# Patient Record
Sex: Female | Born: 1960 | Race: Black or African American | Hispanic: No | Marital: Married | Smoking: Never smoker
Health system: Southern US, Community
[De-identification: ages and names within clinical notes are randomized; demographics above are authoritative.]

## PROBLEM LIST (undated history)

## (undated) DIAGNOSIS — D649 Anemia, unspecified: Secondary | ICD-10-CM

## (undated) DIAGNOSIS — D869 Sarcoidosis, unspecified: Secondary | ICD-10-CM

## (undated) HISTORY — DX: Anemia, unspecified: D64.9

## (undated) HISTORY — DX: Sarcoidosis, unspecified: D86.9

---
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
## 1986-12-19 HISTORY — PX: HERNIA REPAIR: SHX51

## 1999-12-20 HISTORY — PX: CYST EXCISION: SHX5701

## 2005-12-19 HISTORY — PX: PARTIAL HYSTERECTOMY: SHX80

## 2015-05-22 ENCOUNTER — Encounter: Payer: Self-pay | Admitting: Internal Medicine

## 2015-05-22 ENCOUNTER — Ambulatory Visit (INDEPENDENT_AMBULATORY_CARE_PROVIDER_SITE_OTHER): Payer: BLUE CROSS/BLUE SHIELD | Admitting: Internal Medicine

## 2015-05-22 VITALS — BP 160/90 | HR 82 | Temp 98.3°F | Resp 18 | Ht 63.0 in | Wt 158.8 lb

## 2015-05-22 DIAGNOSIS — R030 Elevated blood-pressure reading, without diagnosis of hypertension: Secondary | ICD-10-CM

## 2015-05-22 DIAGNOSIS — L299 Pruritus, unspecified: Secondary | ICD-10-CM

## 2015-05-22 DIAGNOSIS — J309 Allergic rhinitis, unspecified: Secondary | ICD-10-CM

## 2015-05-22 DIAGNOSIS — D863 Sarcoidosis of skin: Secondary | ICD-10-CM | POA: Diagnosis not present

## 2015-05-22 MED ORDER — MONTELUKAST SODIUM 10 MG PO TABS: 10.0000 mg | ORAL_TABLET | Freq: Every day | ORAL | Status: DC

## 2015-05-22 NOTE — Progress Notes (Signed)
Patient ID: Christine Miles, female   DOB: 1961-01-09, 54 y.o.   MRN: [id number]    Location:    [facility]   Place of Service:  OFFICE    Advanced Directive information  LIVING WILL  Chief Complaint  Patient presents with  . Establish Care    Establish care    HPI  54 yo female seen today as a new pt. She has sarcoidosis and sees Dr Gypsy Lorehinnasami (Hematology in [facility]). She is stable. Currently not taking any meds.   She c/o cold intolerance of unknown duration.   She is c/a "allergies"  From food seasonings and environmental sources. She gets nasal congestion, sneezing, rhinorrhea, scratchy throat. No hives but feels itchy "a lot". Uses vasoline for skin care and occasionally mixes with  Jergen's. The itching occurs mostly at night. This is a family trait. She has not seen a rash. She has tried OTC antihistamine without relief.    Past Medical History  Diagnosis Date  . Anemia   . Sarcoidosis     Past Surgical History  Procedure Laterality Date  . Breast cyst excision Right 1980  . Hernia repair  1988    navel  . Cyst excision  2001    behind ear  . Cesarean section  1993  . Partial hysterectomy  2007    Patient Care Team: Kirt BoysMonica Najee Manninen, DO as PCP - General (Internal Medicine) Silvestre MomentBernard Chinnasami, MD (Hematology and Oncology)  History   Social History  . Marital Status: Married    Spouse Name: N/A  . Number of Children: N/A  . Years of Education: N/A   Occupational History  . Not on file.   Social History Main Topics  . Smoking status: Never Smoker   . Smokeless tobacco: Never Used  . Alcohol Use: No  . Drug Use: No  . Sexual Activity: Not on file   Other Topics Concern  . Not on file   Social History Narrative   Diet:      Do you drink/ eat things with caffeine? Yes, occassionally      Marital status:  Marrried                             What year were you married ?  1990      Do you live in a house, apartment,assistred living, condo, trailer,  etc.)? House      Is it one or more stories?       How many persons live in your home ?      Do you have any pets in your home ?(please list)  No      Current or past profession:      Do you exercise?  Yes                            Type & how often: 3-4 x weekly      Do you have a living will?  Yes       Do you have a DNR form?                       If not, do you want to discuss one? No      Do you have signed POA?HPOA forms?                 If so, please bring to  your        appointment           reports that she has never smoked. She has never used smokeless tobacco. She reports that she does not drink alcohol or use illicit drugs.  Family History  Problem Relation Age of Onset  . Cancer Mother   . Diabetes Father    Family Status  Relation Status Death Age  . Father Deceased 71  . Mother Deceased 45  . Sister Alive   . Brother Alive   . Brother Alive   . Daughter Alive   . Son Alive      There is no immunization history on file for this patient.  Not on File  Medications: Patient's Medications  New Prescriptions   No medications on file  Previous Medications   CHOLECALCIFEROL (PA VITAMIN D-3 GUMMY PO)    Take by mouth. Take 3000 /u by mouth daily   OMEGA-3 FATTY ACIDS (OMEGA-3 PO)    Take 100 mg by mouth daily  Modified Medications   No medications on file  Discontinued Medications   No medications on file    Review of Systems  Constitutional: Negative for fever, chills, diaphoresis, activity change, appetite change and fatigue.  HENT: Negative for ear pain and sore throat.   Eyes: Visual disturbance: corrective lenses.  Respiratory: Negative for cough, chest tightness and shortness of breath.   Cardiovascular: Negative for chest pain, palpitations and leg swelling.  Gastrointestinal: Negative for nausea, vomiting, abdominal pain, diarrhea, constipation and blood in stool.  Genitourinary: Negative for dysuria.  Musculoskeletal: Negative for  arthralgias.  Allergic/Immunologic: Positive for environmental allergies.  Neurological: Negative for dizziness, tremors, numbness and headaches.  Hematological: Bruises/bleeds easily (petechiae).  Psychiatric/Behavioral: Negative for sleep disturbance. The patient is not nervous/anxious.     Filed Vitals:   05/22/15 1302  BP: 160/90  Pulse: 82  Temp: 98.3 F (36.8 C)  TempSrc: Oral  Resp: 18  Height:  (1.6 m)  Weight: 158 lb 12.8 oz (72.031 kg)  SpO2: 98%   Body mass index is 28.14 kg/(m^2).  Physical Exam  Constitutional: She is oriented to person, place, and time. She appears well-developed and well-nourished.  HENT:  Mouth/Throat: Oropharynx is clear and moist. No oropharyngeal exudate.  Eyes: Pupils are equal, round, and reactive to light. No scleral icterus.  Neck: Neck supple. No tracheal deviation present. No thyromegaly present.  Cardiovascular: Normal rate, regular rhythm, normal heart sounds and intact distal pulses.  Exam reveals no gallop and no friction rub.   No murmur heard. No LE edema b/l. no calf TTP. No carotid bruit b/l  Pulmonary/Chest: Effort normal and breath sounds normal. No stridor. No respiratory distress. She has no wheezes. She has no rales.  Abdominal: Soft. Bowel sounds are normal. She exhibits no distension and no mass. There is no tenderness. There is no rebound and no guarding.  Lymphadenopathy:    She has no cervical adenopathy.  Neurological: She is alert and oriented to person, place, and time. She has normal reflexes.  Skin: Skin is warm and dry. No rash noted.  Psychiatric: She has a normal mood and affect. Her behavior is normal.     Labs reviewed: No results found for any previous visit.  No results found.   Assessment/Plan   ICD-9-CM ICD-10-CM   1. Elevated blood pressure (not hypertension) 796.2 R03.0   2. Itching - unknown etiology; worse at night 698.9 L29.9 Ambulatory referral to Allergy  3. Allergic  rhinitis,  unspecified allergic rhinitis type 477.9 J30.9 montelukast (SINGULAIR) 10 MG tablet     Ambulatory referral to Allergy  4.      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 Avoid using vasoline as it is occlusive. Recommend aquaphor ointment  --DASH diet discussed and education material supplied  --Check blood pressure at home twice daily and record. Bring readings to next appointment  --bring copy of Living Will to next appt  --Follow up in 2 mos to reck blood pressure. No med Rx today as she would like to try diet 1st  Christine Miles  [facility] [address] [place], [place] [address] [phone] Cell (Monday-Friday 8 AM - 5 PM) [phone] After 5 PM and follow prompts

## 2015-05-22 NOTE — Patient Instructions (Signed)
Check blood pressure at home twice daily and record. Bring readings to next appointment  Follow up in 2 mos to reck blood pressure.  DASH Eating Plan DASH stands for "Dietary Approaches to Stop Hypertension." The DASH eating plan is a healthy eating plan that has been shown to reduce high blood pressure (hypertension). Additional health benefits may include reducing the risk of type 2 diabetes mellitus, heart disease, and stroke. The DASH eating plan may also help with weight loss. WHAT DO I NEED TO KNOW ABOUT THE DASH EATING PLAN? For the DASH eating plan, you will follow these general guidelines:  Choose foods with a percent daily value for sodium of less than 5% (as listed on the food label).  Use salt-free seasonings or herbs instead of table salt or sea salt.  Check with your health care provider or pharmacist before using salt substitutes.  Eat lower-sodium products, often labeled as "lower sodium" or "no salt added."  Eat fresh foods.  Eat more vegetables, fruits, and low-fat dairy products.  Choose whole grains. Look for the word "whole" as the first word in the ingredient list.  Choose fish and skinless chicken or Mal[place]key more often than red meat. Limit fish, poultry, and meat to 6 oz (170 g) each day.  Limit sweets, desserts, sugars, and sugary drinks.  Choose heart-healthy fats.  Limit cheese to 1 oz (28 g) per day.  Eat more home-cooked food and less restaurant, buffet, and fast food.  Limit fried foods.  Cook foods using methods other than frying.  Limit canned vegetables. If you do use them, rinse them well to decrease the sodium.  When eating at a restaurant, ask that your food be prepared with less salt, or no salt if possible. WHAT FOODS CAN I EAT? Seek help from a dietitian for individual calorie needs. Grains Whole grain or whole wheat bread. Brown rice. Whole grain or whole wheat pasta. Quinoa, bulgur, and whole grain cereals. Low-sodium cereals. Corn or  whole wheat flour tortillas. Whole grain cornbread. Whole grain crackers. Low-sodium crackers. Vegetables Fresh or frozen vegetables (raw, steamed, roasted, or grilled). Low-sodium or reduced-sodium tomato and vegetable juices. Low-sodium or reduced-sodium tomato sauce and paste. Low-sodium or reduced-sodium canned vegetables.  Fruits All fresh, canned (in natural juice), or frozen fruits. Meat and Other Protein Products Ground beef (85% or leaner), grass-fed beef, or beef trimmed of fat. Skinless chicken or [place]urkey. Ground chicken or [place]urkey. Pork trimmed of fat. All fish and seafood. Eggs. Dried beans, peas, or lentils. Unsalted nuts and seeds. Unsalted canned beans. Dairy Low-fat dairy products, such as skim or 1% milk, 2% or reduced-fat cheeses, low-fat ricotta or cottage cheese, or plain low-fat yogurt. Low-sodium or reduced-sodium cheeses. Fats and Oils Tub margarines without trans fats. Light or reduced-fat mayonnaise and salad dressings (reduced sodium). Avocado. Safflower, olive, or canola oils. Natural peanut or almond butter. Other Unsalted popcorn and pretzels. The items listed above may not be a complete list of recommended foods or beverages. Contact your dietitian for more options. WHAT FOODS ARE NOT RECOMMENDED? Grains White bread. White pasta. White rice. Refined cornbread. Bagels and croissants. Crackers that contain trans fat. Vegetables Creamed or fried vegetables. Vegetables in a cheese sauce. Regular canned vegetables. Regular canned tomato sauce and paste. Regular tomato and vegetable juices. Fruits Dried fruits. Canned fruit in light or heavy syrup. Fruit juice. Meat and Other Protein Products Fatty cuts of meat. Ribs, chicken wings, bacon, sausage, bologna, salami, chitterlings, fatback, hot dogs, bratwurst, and packaged  luncheon meats. Salted nuts and seeds. Canned beans with salt. Dairy Whole or 2% milk, cream, half-and-half, and cream cheese. Whole-fat or sweetened  yogurt. Full-fat cheeses or blue cheese. Nondairy creamers and whipped toppings. Processed cheese, cheese spreads, or cheese curds. Condiments Onion and garlic salt, seasoned salt, table salt, and sea salt. Canned and packaged gravies. Worcestershire sauce. Tartar sauce. Barbecue sauce. Teriyaki sauce. Soy sauce, including reduced sodium. Steak sauce. Fish sauce. Oyster sauce. Cocktail sauce. Horseradish. Ketchup and mustard. Meat flavorings and tenderizers. Bouillon cubes. Hot sauce. Tabasco sauce. Marinades. Taco seasonings. Relishes. Fats and Oils Butter, stick margarine, lard, shortening, ghee, and bacon fat. Coconut, palm kernel, or palm oils. Regular salad dressings. Other Pickles and olives. Salted popcorn and pretzels. The items listed above may not be a complete list of foods and beverages to avoid. Contact your dietitian for more information. WHERE CAN I FIND MORE INFORMATION? National Heart, Lung, and Blood Institute: travelstabloid.com Document Released: 11/24/2011 Document Revised: 04/21/2014 Document Reviewed: 10/09/2013 [facility] Patient Information 2015 [place], [place]. This information is not intended to replace advice given to you by your health care provider. Make sure you discuss any questions you have with your health care provider.

## 2015-05-23 DIAGNOSIS — R030 Elevated blood-pressure reading, without diagnosis of hypertension: Secondary | ICD-10-CM | POA: Insufficient documentation

## 2015-05-23 DIAGNOSIS — D863 Sarcoidosis of skin: Secondary | ICD-10-CM | POA: Insufficient documentation

## 2015-05-23 DIAGNOSIS — J309 Allergic rhinitis, unspecified: Secondary | ICD-10-CM | POA: Insufficient documentation

## 2015-05-23 DIAGNOSIS — L299 Pruritus, unspecified: Secondary | ICD-10-CM | POA: Insufficient documentation

## 2015-07-22 ENCOUNTER — Encounter: Payer: Self-pay | Admitting: Internal Medicine

## 2015-07-22 ENCOUNTER — Ambulatory Visit: Payer: BLUE CROSS/BLUE SHIELD | Admitting: Internal Medicine

## 2015-08-07 ENCOUNTER — Ambulatory Visit (INDEPENDENT_AMBULATORY_CARE_PROVIDER_SITE_OTHER): Payer: BLUE CROSS/BLUE SHIELD | Admitting: Internal Medicine

## 2015-08-07 ENCOUNTER — Encounter: Payer: Self-pay | Admitting: Internal Medicine

## 2015-08-07 VITALS — BP 130/88 | HR 79 | Temp 98.2°F | Resp 18 | Ht 63.0 in | Wt 155.6 lb

## 2015-08-07 DIAGNOSIS — J309 Allergic rhinitis, unspecified: Secondary | ICD-10-CM

## 2015-08-07 DIAGNOSIS — R030 Elevated blood-pressure reading, without diagnosis of hypertension: Secondary | ICD-10-CM | POA: Diagnosis not present

## 2015-08-07 DIAGNOSIS — D863 Sarcoidosis of skin: Secondary | ICD-10-CM

## 2015-08-07 NOTE — Patient Instructions (Addendum)
Continue with lifestyle modifications - diet and exercise  Follow up in 3-4 mos for reck blood pressure

## 2015-08-07 NOTE — Progress Notes (Signed)
Patient ID: Christine Miles, female   DOB: November 08, 1961, 54 y.o.   MRN: [id number]    Location:    [facility]   Place of Service:  OFFICE   Chief Complaint  Patient presents with  . Medical Management of Chronic Issues    2 month follow-up for bp    HPI:  54 yo female seen today for f/u elevated blood pressure and seasonal allergy. She cancelled her allergy appt as she reports feeling better. Less itching. She now uses herbal remedy for fragrances and it helps. Stopped singulair as it was ineffective  She does not check her BP at home. She has changed her diet and is eating healthier and stopped using salt. She has lost about 3 lbs since last OV. She increased exercise  Past Medical History  Diagnosis Date  . Anemia   . Sarcoidosis     Past Surgical History  Procedure Laterality Date  . Breast cyst excision Right 1980  . Hernia repair  1988    navel  . Cyst excision  2001    behind ear  . Cesarean section  1993  . Partial hysterectomy  2007    Patient Care Team: Kirt Boys, DO as PCP - General (Internal Medicine) Silvestre Moment, MD (Hematology and Oncology)  Social History   Social History  . Marital Status: Married    Spouse Name: N/A  . Number of Children: N/A  . Years of Education: N/A   Occupational History  . Not on file.   Social History Main Topics  . Smoking status: Never Smoker   . Smokeless tobacco: Never Used  . Alcohol Use: No  . Drug Use: No  . Sexual Activity: Not on file   Other Topics Concern  . Not on file   Social History Narrative   Diet:      Do you drink/ eat things with caffeine? Yes, occassionally      Marital status:  Marrried                             What year were you married ?  1990      Do you live in a house, apartment,assistred living, condo, trailer, etc.)? House      Is it one or more stories?       How many persons live in your home ?      Do you have any pets in your home ?(please list)  No      Current or  past profession:      Do you exercise?  Yes                            Type & how often: 3-4 x weekly      Do you have a living will?  Yes       Do you have a DNR form?                       If not, do you want to discuss one? No      Do you have signed POA?HPOA forms?                 If so, please bring to your        appointment           reports that she has never smoked. She has never  used smokeless tobacco. She reports that she does not drink alcohol or use illicit drugs.  Not on File  Medications: Patient's Medications  New Prescriptions   No medications on file  Previous Medications   CHOLECALCIFEROL (PA VITAMIN D-3 GUMMY PO)    Take by mouth. Take 3000 /u by mouth daily   GLUCOSAMINE HCL-MSM (MSM GLUCOSAMINE PO)    Take 2 capsules by mouth daily   MELATONIN 300 MCG TABS    Take 3-4 tablets by mouth for sleep   MULTIPLE VITAMINS-MINERALS (MULTIVITAMIN GUMMIES WOMENS) CHEW    Take 2 gummies by mouth daily   NON FORMULARY    Native Remedies (AllergyEase  Scent & Phenol) spray 2-3 times in mouth daily   OMEGA-3 FATTY ACIDS (OMEGA-3 PO)    Take 100 mg by mouth daily  Modified Medications   No medications on file  Discontinued Medications   MONTELUKAST (SINGULAIR) 10 MG TABLET    Take 1 tablet (10 mg total) by mouth at bedtime.    Review of Systems  Constitutional: Negative for fever, chills, diaphoresis, activity change, appetite change and fatigue.  HENT: Negative for ear pain and sore throat.   Eyes: Negative for visual disturbance.  Respiratory: Negative for cough, chest tightness and shortness of breath.   Cardiovascular: Negative for chest pain, palpitations and leg swelling.  Gastrointestinal: Negative for nausea, vomiting, abdominal pain, diarrhea, constipation and blood in stool.  Genitourinary: Negative for dysuria.  Musculoskeletal: Negative for arthralgias.  Neurological: Negative for dizziness, tremors, numbness and headaches.  Psychiatric/Behavioral:  Negative for sleep disturbance. The patient is not nervous/anxious.     Filed Vitals:   08/07/15 1536  BP: 130/88 repeat by myself 156/94  Pulse: 79  Temp: 98.2 F (36.8 C)  TempSrc: Oral  Resp: 18  Height:  (1.6 m)  Weight: 155 lb 9.6 oz (70.58 kg)  SpO2: 97%   Body mass index is 27.57 kg/(m^2).  Physical Exam  Constitutional: She is oriented to person, place, and time. She appears well-developed and well-nourished.  HENT:  Mouth/Throat: Oropharynx is clear and moist. No oropharyngeal exudate.  Eyes: Pupils are equal, round, and reactive to light. No scleral icterus.  Neck: Neck supple. Carotid bruit is not present. No tracheal deviation present. No thyromegaly present.  Cardiovascular: Normal rate, regular rhythm, normal heart sounds and intact distal pulses.  Exam reveals no gallop and no friction rub.   No murmur heard. No LE edema b/l. no calf TTP.   Pulmonary/Chest: Effort normal and breath sounds normal. No stridor. No respiratory distress. She has no wheezes. She has no rales.  Abdominal: Soft. Bowel sounds are normal. She exhibits no distension and no mass. There is no hepatomegaly. There is no tenderness. There is no rebound and no guarding.  Lymphadenopathy:    She has no cervical adenopathy.  Neurological: She is alert and oriented to person, place, and time. She has normal reflexes.  Skin: Skin is warm and dry. No rash noted.  Psychiatric: She has a normal mood and affect. Her behavior is normal. Judgment and thought content normal.     Labs reviewed: No results found for any previous visit.  No results found.   Assessment/Plan   ICD-9-CM ICD-10-CM   1. Elevated blood pressure (not hypertension) 796.2 R03.0   2. Allergic rhinitis, unspecified allergic rhinitis type - stable 477.9 J30.9   3. Cutaneous sarcoidosis - stable 135 D86.3     Continue with lifestyle modifications - diet and exercise  Follow up  in 3-4 mos for BP. If still elevated, will  need to start antihypertensive  Kaleen Rochette S. Ancil Linsey  [facility] [address] [place], [place] [address] [phone] Cell (Monday-Friday 8 AM - 5 PM) [phone] After 5 PM and follow prompts

## 2017-07-20 LAB — HM MAMMOGRAPHY

## 2017-07-21 ENCOUNTER — Encounter: Payer: Self-pay | Admitting: *Deleted

## 2017-07-21 NOTE — Progress Notes (Signed)
[facility] [place]

## 2018-08-08 ENCOUNTER — Encounter: Payer: Self-pay | Admitting: Internal Medicine

## 2018-08-27 ENCOUNTER — Other Ambulatory Visit: Payer: Self-pay | Admitting: Internal Medicine

## 2018-08-27 DIAGNOSIS — Z1231 Encounter for screening mammogram for malignant neoplasm of breast: Secondary | ICD-10-CM

## 2018-08-29 ENCOUNTER — Other Ambulatory Visit: Payer: Self-pay | Admitting: Internal Medicine

## 2018-08-29 ENCOUNTER — Ambulatory Visit (INDEPENDENT_AMBULATORY_CARE_PROVIDER_SITE_OTHER): Payer: No Typology Code available for payment source

## 2018-08-29 DIAGNOSIS — Z1231 Encounter for screening mammogram for malignant neoplasm of breast: Secondary | ICD-10-CM

## 2018-09-12 ENCOUNTER — Ambulatory Visit: Payer: No Typology Code available for payment source | Admitting: Osteopathic Medicine

## 2018-09-26 ENCOUNTER — Ambulatory Visit (INDEPENDENT_AMBULATORY_CARE_PROVIDER_SITE_OTHER): Payer: No Typology Code available for payment source | Admitting: Osteopathic Medicine

## 2018-09-26 ENCOUNTER — Encounter (INDEPENDENT_AMBULATORY_CARE_PROVIDER_SITE_OTHER): Payer: Self-pay

## 2018-09-26 ENCOUNTER — Encounter: Payer: Self-pay | Admitting: Osteopathic Medicine

## 2018-09-26 VITALS — BP 150/88 | HR 71 | Temp 98.2°F | Ht 64.0 in | Wt 163.2 lb

## 2018-09-26 DIAGNOSIS — Z23 Encounter for immunization: Secondary | ICD-10-CM | POA: Diagnosis not present

## 2018-09-26 DIAGNOSIS — R0989 Other specified symptoms and signs involving the circulatory and respiratory systems: Secondary | ICD-10-CM

## 2018-09-26 NOTE — Patient Instructions (Signed)
Plan to return for nurse visit to verify home blood pressure cuff. In the meantime, be keeping a record of your blood pressures at home and bring this with you to the visit with the nurse.   If your cuff is measuring within 5-10 points of ours AND your home numbers are less than 140/90 (ideally less than 130/80) then nothing else to do.   If your home blood pressure cuff is inaccurate or is accurate but measuring above goal, we will need to talk about adjusting your medications.   

## 2018-09-26 NOTE — Progress Notes (Signed)
HPI: Christine Miles is a 57 y.o. female who  has a past medical history of Anemia and Sarcoidosis.  she presents to [facility] today, 09/26/18,  for chief complaint of: New to establish HTN  Recently following with OB/GYN, blood pressure elevated at 09/24/2018 visit, measuring 170/110, patient reports that it was rechecked and was a lot better.  BP Readings from Last 3 Encounters:  09/26/18 (!) 152/87 on recheck, intake was 182/110   08/07/15 130/88  05/22/15 (!) 160/90     Past medical, surgical, social and family history reviewed:  Patient Active Problem List   Diagnosis Date Noted  . 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 Rhinitis, allergic 05/23/2015  . Elevated blood pressure (not hypertension) 05/23/2015  . Itching 05/23/2015    Past Surgical History:  Procedure Laterality Date  . BREAST CYST EXCISION Right 1980   age 44  . CESAREAN SECTION  1993  . CYST EXCISION  2001   behind ear  . HERNIA REPAIR  1988   navel  . PARTIAL HYSTERECTOMY  2007    Social History   Tobacco Use  . Smoking status: Never Smoker  . Smokeless tobacco: Never Used  Substance Use Topics  . Alcohol use: No    Alcohol/week: 0.0 standard drinks    Family History  Problem Relation Age of Onset  . Diabetes Father   . Cancer Mother   . Breast cancer Mother   . Breast cancer Maternal Aunt      Current medication list and allergy/intolerance information reviewed:    Current Outpatient Medications  Medication Sig Dispense Refill  . Cholecalciferol (PA VITAMIN D-3 GUMMY PO) Take by mouth. Take 3000 /u by mouth daily    . Glucosamine HCl-MSM (MSM GLUCOSAMINE PO) Take 2 capsules by mouth daily    . Melatonin 300 MCG TABS Take 3-4 tablets by mouth for sleep    . Multiple Vitamins-Minerals (MULTIVITAMIN GUMMIES WOMENS) CHEW Take 2 gummies by mouth daily    . NON FORMULARY Native Remedies (AllergyEase  Scent & Phenol) spray 2-3 times in mouth daily    .  Omega-3 Fatty Acids (OMEGA-3 PO) Take 100 mg by mouth daily     No current facility-administered medications for this visit.     No Known Allergies    Review of Systems:  Constitutional:  No  fever, no chills, No recent illness, No unintentional weight changes. No significant fatigue.   HEENT: No  headache, no vision change, no hearing change, No sore throat, No  sinus pressure  Cardiac: No  chest pain, No  pressure, No palpitations, No  Orthopnea  Respiratory:  No  shortness of breath. No  Cough  Gastrointestinal: No  abdominal pain, No  nausea, No  vomiting,  No  blood in stool, No  diarrhea, No  constipation   Musculoskeletal: No new myalgia/arthralgia  Skin: No  Rash, No other wounds/concerning lesions  Genitourinary: No  incontinence, No  abnormal genital bleeding, No abnormal genital discharge  Hem/Onc: No  easy bruising/bleeding, No  abnormal lymph node  Endocrine: No cold intolerance,  No heat intolerance. No polyuria/polydipsia/polyphagia   Neurologic: No  weakness, No  dizziness, No  slurred speech/focal weakness/facial droop  Psychiatric: No  concerns with depression, No  concerns with anxiety, No sleep problems, No mood problems  Exam:  BP (!) 150/88   Pulse 71   Temp 98.2 F (36.8 C) (Oral)   Ht 5\' 4"  (1.626 m)   Wt 163 lb 3.2  oz (74 kg)   BMI 28.01 kg/m   Constitutional: VS see above. General Appearance: alert, well-developed, well-nourished, NAD  Eyes: Normal lids and conjunctive, non-icteric sclera  Ears, Nose, Mouth, Throat: MMM, Normal external inspection ears/nares/mouth/lips/gums.   Neck: No masses, trachea midline. No thyroid enlargement. No tenderness/mass appreciated. No lymphadenopathy  Respiratory: Normal respiratory effort. no wheeze, no rhonchi, no rales  Cardiovascular: S1/S2 normal, no murmur, no rub/gallop auscultated. RRR. No lower extremity edema. egaly, no splenomegaly. No hernia appreciated. Bowel sounds normal. Rectal exam  deferred.   Musculoskeletal: Gait normal. No clubbing/cyanosis of digits.   Neurological: Normal balance/coordination. No tremor. No cranial nerve deficit on limited exam. Motor and sensation intact and symmetric. Cerebellar reflexes intact.   Skin: warm, dry, intact. No rash/ulcer. No concerning nevi or subq nodules on limited exam.    Psychiatric: Normal judgment/insight. Normal mood and affect. Oriented x3.       ASSESSMENT/PLAN:   Labile blood pressure - Plan: CBC, COMPLETE METABOLIC PANEL WITH GFR, Lipid panel, TSH, Urinalysis, Routine w reflex microscopic  Need for Tdap vaccination - Plan: Tdap vaccine greater than or equal to 7yo IM    Patient Instructions   Plan to return for nurse visit to verify home blood pressure cuff. In the meantime, be keeping a record of you rblood pressures at home and bring this with you to the visit with the nurse.   If your cuff is measuring within 5-10 points of ours AND your home numbers are less than 140/90 (ideally less than 130/80) then nothing else to do.   If your home blood pressure cuff is inaccurate or is accurate but measuring above goal, we will need to talk about adjusting your medications.     Visit summary with medication list and pertinent instructions was printed for patient to review. All questions at time of visit were answered - patient instructed to contact office with any additional concerns. ER/RTC precautions were reviewed with the patient.   Follow-up plan: Return for nurse visit verify home BP monitor 1-2 weeks.    Please note: voice recognition software was used to produce this document, and typos may escape review. Please contact Dr. Lyn Hollingshead for any needed clarifications.

## 2018-09-27 LAB — COMPLETE METABOLIC PANEL WITH GFR
AG RATIO: 1.5 (calc) (ref 1.0–2.5)
ALT: 11 U/L (ref 6–29)
AST: 15 U/L (ref 10–35)
Albumin: 4.4 g/dL (ref 3.6–5.1)
Alkaline phosphatase (APISO): 66 U/L (ref 33–130)
BUN: 11 mg/dL (ref 7–25)
CALCIUM: 9.8 mg/dL (ref 8.6–10.4)
CO2: 28 mmol/L (ref 20–32)
CREATININE: 0.85 mg/dL (ref 0.50–1.05)
Chloride: 99 mmol/L (ref 98–110)
GFR, EST AFRICAN AMERICAN: 89 mL/min/{1.73_m2} (ref >=60)
GFR, EST NON AFRICAN AMERICAN: 77 mL/min/{1.73_m2} (ref >=60)
GLOBULIN: 3 g/dL (ref 1.9–3.7)
Glucose, Bld: 77 mg/dL (ref 65–99)
POTASSIUM: 4.5 mmol/L (ref 3.5–5.3)
SODIUM: 136 mmol/L (ref 135–146)
Total Bilirubin: 1.9 mg/dL — ABNORMAL HIGH (ref 0.2–1.2)
Total Protein: 7.4 g/dL (ref 6.1–8.1)

## 2018-09-27 LAB — LIPID PANEL
CHOL/HDL RATIO: 2.6 (calc) (ref <5.0)
Cholesterol: 148 mg/dL (ref <200)
HDL: 57 mg/dL (ref >50)
LDL Cholesterol (Calc): 78 mg/dL (calc)
NON-HDL CHOLESTEROL (CALC): 91 mg/dL (ref <130)
Triglycerides: 52 mg/dL (ref <150)

## 2018-09-27 LAB — CBC
HCT: 41.5 % (ref 35.0–45.0)
HEMOGLOBIN: 14 g/dL (ref 11.7–15.5)
MCH: 31 pg (ref 27.0–33.0)
MCHC: 33.7 g/dL (ref 32.0–36.0)
MCV: 91.8 fL (ref 80.0–100.0)
MPV: 10.5 fL (ref 7.5–12.5)
Platelets: 243 10*3/uL (ref 140–400)
RBC: 4.52 10*6/uL (ref 3.80–5.10)
RDW: 12 % (ref 11.0–15.0)
WBC: 3.1 10*3/uL — AB (ref 3.8–10.8)

## 2018-09-27 LAB — TSH: TSH: 1.08 m[IU]/L

## 2018-09-27 LAB — URINALYSIS, ROUTINE W REFLEX MICROSCOPIC
BILIRUBIN URINE: NEGATIVE
Glucose, UA: NEGATIVE
HGB URINE DIPSTICK: NEGATIVE
Leukocytes, UA: NEGATIVE
Nitrite: NEGATIVE
PROTEIN: NEGATIVE
Specific Gravity, Urine: 1.025 (ref 1.001–1.03)
pH: 5.5 (ref 5.0–8.0)

## 2018-09-28 ENCOUNTER — Encounter: Payer: Self-pay | Admitting: Osteopathic Medicine

## 2018-12-26 ENCOUNTER — Ambulatory Visit: Payer: No Typology Code available for payment source | Admitting: Osteopathic Medicine

## 2019-02-14 ENCOUNTER — Ambulatory Visit (INDEPENDENT_AMBULATORY_CARE_PROVIDER_SITE_OTHER): Payer: No Typology Code available for payment source | Admitting: Osteopathic Medicine

## 2019-02-14 ENCOUNTER — Encounter: Payer: Self-pay | Admitting: Osteopathic Medicine

## 2019-02-14 VITALS — BP 174/107 | HR 66 | Temp 98.1°F | Wt 163.2 lb

## 2019-02-14 DIAGNOSIS — R0989 Other specified symptoms and signs involving the circulatory and respiratory systems: Secondary | ICD-10-CM | POA: Diagnosis not present

## 2019-02-14 DIAGNOSIS — F320 Major depressive disorder, single episode, mild: Secondary | ICD-10-CM | POA: Diagnosis not present

## 2019-02-14 MED ORDER — SERTRALINE HCL 25 MG PO TABS: 25.0000 mg | ORAL_TABLET | Freq: Every day | ORAL | 0 refills | Status: DC

## 2019-02-14 MED ORDER — VALSARTAN-HYDROCHLOROTHIAZIDE 80-12.5 MG PO TABS: 1.0000 | ORAL_TABLET | Freq: Every day | ORAL | 0 refills | Status: DC

## 2019-02-14 NOTE — Progress Notes (Signed)
HPI: Christine Miles is a 58 y.o. female who  has a past medical history of Anemia and Sarcoidosis.  she presents to [facility] today, 02/14/19,  for chief complaint of:  BP Depression   Blood pressure: Patient was advised at last visit to return for nurse to check home blood pressure monitor and recheck blood pressure here, patient did not ever follow-up.  She states she is still checking blood pressure at the pharmacy periodically, anywhere from 138 systolic to 160s systolic.  No chest pain, pressure, shortness of breath.  Depression: Patient reports worsening moods over the past couple of months, depression with insomnia and occasional anxiety episodes.  She does not really feel anxiety is her main issue.  She and her husband have been going through issues with a custody battle with her grandniece.  Some other family issues.  Patient has never been on psychiatric medications before.  Would also like to discuss referral to therapist.    At today's visit 02/14/19 ... PMH, PSH, FH reviewed and updated as needed.  Current medication list and allergy/intolerance hx reviewed and updated as needed. (See remainder of HPI, ROS, Phys Exam below)          ASSESSMENT/PLAN: The primary encounter diagnosis was Labile blood pressure. A diagnosis of Depression, major, single episode, mild (HCC) was also pertinent to this visit.     Meds ordered this encounter  Medications  . valsartan-hydrochlorothiazide (DIOVAN-HCT) 80-12.5 MG tablet    Sig: Take 1 tablet by mouth daily.    Dispense:  90 tablet    Refill:  0  . sertraline (ZOLOFT) 25 MG tablet    Sig: Take 1 tablet (25 mg total) by mouth daily.    Dispense:  90 tablet    Refill:  0    Patient Instructions  Depression:   We are starting a medication today called Zoloft to help treat your depression. This is a daily medication to help control your symptoms.   I also highly encourage my patients  who are suffering from depression to seek care with a counselor or therapist. A therapist can coach you in techniques to recognize and deal with troubling thought patterns and behaviors. The ability to cope with external stressors is crucial to overall mental health. I have placed a referral to behavioral health for counseling/therapy. Please let us know if you don't hear back about that referral.   Expect a call or message form this office in the next 2 weeks to check in: If you're doing well on the medicine but not feeling any effect, we can increase the dose. If you're starting to feel some effect/improvement, we can hold off on a dose increase and reevaluate at your office visit.   Let's plan to follow up in the office in 4-6 weeks. At that time, we can talk about how well the medicine is working for you, and we can consider increasing the dose, adding another medicine, etc.   If we are having trouble finding a good medication regimen for you, we can consult with a psychiatrist to assist with medication management.   If you experience problematic side effects, please let me know ASAP - we can switch the medicine any time, and we don't need an appointment for this.   For immediate mental health services:  [facility], [address], [place], [place] [address], [phone]  [facility], [address], [place], [place] [address], [phone]  Any  emergency room  National Suicide Prevention [facility]eline, [phone]  Any questions or concerns, call me!     Blood pressure:  We are starting a medication today called valsartan-hydrochlorothiazide, this is 1 pill with 2 medications in it to help address blood pressure.  Our goal is to get the blood pressure numbers 140/90 or less, ideally 130/80 or less.  Let us have you back in the office in 1 week for a nurse to recheck blood pressure.  If at goal, can continue the medication.  If not, can  increase the dose of this medicine     Follow-up plan: Return for Nurse visit 1 week blood pressure recheck, visit with Dr. Lyn Hollingshead in 4-6 weeks recheck depression.                                                 ################################################# ################################################# ################################################# #################################################    Current Meds  Medication Sig  . Cholecalciferol (PA VITAMIN D-3 GUMMY PO) Take by mouth. Take 3000 /u by mouth daily  . Glucosamine HCl-MSM (MSM GLUCOSAMINE PO) Take 2 capsules by mouth daily  . Melatonin 300 MCG TABS Take 3-4 tablets by mouth for sleep  . Multiple Vitamins-Minerals (MULTIVITAMIN GUMMIES WOMENS) CHEW Take 2 gummies by mouth daily  . NON FORMULARY Native Remedies (AllergyEase  Scent & Phenol) spray 2-3 times in mouth daily  . Omega-3 Fatty Acids (OMEGA-3 PO) Take 100 mg by mouth daily    No Known Allergies     Review of Systems:  Constitutional: No recent illness  HEENT: No  headache, no vision change  Cardiac: No  chest pain, No  pressure, No palpitations  Respiratory:  No  shortness of breath. No  Cough  Gastrointestinal: No  abdominal pain, no change on bowel habits  Neurologic: No  weakness, No  Dizziness  Psychiatric: +concerns with depression, No  concerns with anxiety  Exam:  BP (!) 174/107 (BP Location: Left Arm, Patient Position: Sitting, Cuff Size: Normal)   Pulse 66   Temp 98.1 F (36.7 C) (Oral)   Wt 163 lb 3.2 oz (74 kg)   BMI 28.01 kg/m   Constitutional: VS see above. General Appearance: alert, well-developed, well-nourished, NAD  Eyes: Normal lids and conjunctive, non-icteric sclera  Ears, Nose, Mouth, Throat: MMM, Normal external inspection ears/nares/mouth/lips/gums.  Neck: No masses, trachea midline.   Respiratory: Normal respiratory effort. no wheeze, no rhonchi, no  rales  Cardiovascular: S1/S2 normal, no murmur, no rub/gallop auscultated. RRR.   Musculoskeletal: Gait normal. Symmetric and independent movement of all extremities  Neurological: Normal balance/coordination. No tremor.  Skin: warm, dry, intact.   Psychiatric: Normal judgment/insight. Normal mood and affect. Oriented x3.       Visit summary with medication list and pertinent instructions was printed for patient to review, patient was advised to alert [place] if any updates are needed. All questions at time of visit were answered - patient instructed to contact office with any additional concerns. ER/RTC precautions were reviewed with the patient and understanding verbalized.     Please note: voice recognition software was used to produce this document, and typos may escape review. Please contact Dr. Lyn Hollingshead for any needed clarifications.    Follow up plan: Return for Nurse visit 1 week blood pressure recheck, visit with Dr. Lyn Hollingshead in 4-6 weeks recheck depression.

## 2019-02-14 NOTE — Patient Instructions (Addendum)
Depression:   We are starting a medication today called Zoloft to help treat your depression. This is a daily medication to help control your symptoms.   I also highly encourage my patients who are suffering from depression to seek care with a counselor or therapist. A therapist can coach you in techniques to recognize and deal with troubling thought patterns and behaviors. The ability to cope with external stressors is crucial to overall mental health. I have placed a referral to behavioral health for counseling/therapy. Please let us know if you don't hear back about that referral.   Expect a call or message form this office in the next 2 weeks to check in: If you're doing well on the medicine but not feeling any effect, we can increase the dose. If you're starting to feel some effect/improvement, we can hold off on a dose increase and reevaluate at your office visit.   Let's plan to follow up in the office in 4-6 weeks. At that time, we can talk about how well the medicine is working for you, and we can consider increasing the dose, adding another medicine, etc.   If we are having trouble finding a good medication regimen for you, we can consult with a psychiatrist to assist with medication management.   If you experience problematic side effects, please let me know ASAP - we can switch the medicine any time, and we don't need an appointment for this.   For immediate mental health services:  [facility], [address] [place], [place] [address], [phone]  [facility], [address] [place], [place] [address], [phone]  Any emergency room  National Suicide Prevention [facility]eline, [phone]  Any questions or concerns, call me!     Blood pressure:  We are starting a medication today called valsartan-hydrochlorothiazide, this is 1 pill with 2 medications in it to help address blood pressure.  Our goal is to get the blood  pressure numbers 140/90 or less, ideally 130/80 or less.  Let us have you back in the office in 1 week for a nurse to recheck blood pressure.  If at goal, can continue the medication.  If not, can increase the dose of this medicine

## 2019-02-20 ENCOUNTER — Ambulatory Visit: Payer: No Typology Code available for payment source | Admitting: Osteopathic Medicine

## 2019-02-26 ENCOUNTER — Telehealth: Payer: Self-pay

## 2019-02-26 NOTE — Telephone Encounter (Signed)
Pt called - wanted provider to be aware that she is doing well on Valsartan-hctz rx. She has been checking her blood pressure and today's reading was 122/78. No other inquiries during the call.

## 2019-02-26 NOTE — Telephone Encounter (Signed)
Excellent, thanks. 

## 2019-03-11 ENCOUNTER — Telehealth: Payer: Self-pay

## 2019-03-11 NOTE — Telephone Encounter (Signed)
Christine Miles called and asked if having a history of low white count puts her in a high risk for a severe symptoms from the coronavirus. Please advise.

## 2019-03-11 NOTE — Telephone Encounter (Signed)
We do not really know the answer to that question.  It is possible that it does.  However that really should not change the recommendations of trying to reduce your risk of contracting coronavirus.

## 2019-03-12 NOTE — Telephone Encounter (Signed)
Left message advising of recommendations.  

## 2019-07-08 ENCOUNTER — Other Ambulatory Visit: Payer: Self-pay | Admitting: Osteopathic Medicine

## 2019-07-11 ENCOUNTER — Other Ambulatory Visit: Payer: Self-pay | Admitting: Osteopathic Medicine

## 2019-07-11 ENCOUNTER — Telehealth: Payer: Self-pay

## 2019-07-11 NOTE — Telephone Encounter (Signed)
Pt called regarding denial for rx refill. As per provider - pt is overdue for a f/u appt. Pls contact pt to schedule a virtual appt. Thanks.   Routing to PCP as FYI.

## 2019-07-11 NOTE — Telephone Encounter (Signed)
Requested medications are due for refill today?  No  Requested medications are on the active medication list?  No  Last refill-Discontinued on medication list on 07/09/2019  Future visit scheduled - No  Notes to clinic  Requested Prescriptions  Pending Prescriptions Disp Refills   hydrochlorothiazide (HYDRODIURIL) 12.5 MG tablet [Pharmacy Med Name: hydroCHLOROthiazide 12.5 MG Oral Tablet] 90 tablet 0    Sig: Take 1 tablet by mouth once daily     Cardiovascular: Diuretics - Thiazide Failed - 07/11/2019  1:00 PM      Failed - Last BP in normal range    BP Readings from Last 1 Encounters:  02/14/19 (!) 174/107         Failed - Valid encounter within last 6 months    Recent Outpatient Visits          4 months ago Labile blood pressure   [facility], [place], DO   9 months ago Labile blood pressure   [facility], [place], DO             Passed - Ca in normal range and within 360 days    Calcium  Date Value Ref Range Status  09/26/2018 9.8 8.6 - 10.4 mg/dL Final         Passed - Cr in normal range and within 360 days    Creat  Date Value Ref Range Status  09/26/2018 0.85 0.50 - 1.05 mg/dL Final    Comment:    For patients >63 years of age, the reference limit for Creatinine is approximately 13% higher for people identified as African-American. .          Passed - K in normal range and within 360 days    Potassium  Date Value Ref Range Status  09/26/2018 4.5 3.5 - 5.3 mmol/L Final         Passed - Na in normal range and within 360 days    Sodium  Date Value Ref Range Status  09/26/2018 136 135 - 146 mmol/L Final

## 2019-07-11 NOTE — Telephone Encounter (Signed)
I sent 30 days of medications but patient is due for a follow-up appointment.  When she last saw me 02/14/2019, we discussed following up for a nurse visit in 1 week to recheck blood pressure and a visit with me in 4 to 6 weeks to recheck depression, she has not been in our office since.  If she does not feel comfortable coming in, we at least need to do a virtual visit

## 2019-07-12 ENCOUNTER — Other Ambulatory Visit: Payer: Self-pay | Admitting: Osteopathic Medicine

## 2019-07-15 NOTE — Telephone Encounter (Signed)
Appointment has been made. No further questions at this time.  

## 2019-07-24 ENCOUNTER — Encounter: Payer: Self-pay | Admitting: Osteopathic Medicine

## 2019-07-24 ENCOUNTER — Ambulatory Visit (INDEPENDENT_AMBULATORY_CARE_PROVIDER_SITE_OTHER): Payer: No Typology Code available for payment source | Admitting: Osteopathic Medicine

## 2019-07-24 VITALS — Wt 167.0 lb

## 2019-07-24 DIAGNOSIS — R0989 Other specified symptoms and signs involving the circulatory and respiratory systems: Secondary | ICD-10-CM | POA: Diagnosis not present

## 2019-07-24 DIAGNOSIS — I10 Essential (primary) hypertension: Secondary | ICD-10-CM

## 2019-07-24 DIAGNOSIS — F320 Major depressive disorder, single episode, mild: Secondary | ICD-10-CM | POA: Diagnosis not present

## 2019-07-24 MED ORDER — SERTRALINE HCL 25 MG PO TABS: 25.0000 mg | ORAL_TABLET | Freq: Every day | ORAL | 3 refills | Status: DC

## 2019-07-24 MED ORDER — HYDROCHLOROTHIAZIDE 25 MG PO TABS: 12.5000 mg | ORAL_TABLET | Freq: Every day | ORAL | 0 refills | Status: DC

## 2019-07-24 NOTE — Progress Notes (Signed)
Virtual Visit via Video (App used: Doximity) Note  I connected with      Christine Miles on 07/24/19 at 1:00 PM by a telemedicine application and verified that I am speaking with the correct person using two identifiers.  Patient is at home I am in office    I discussed the limitations of evaluation and management by telemedicine and the availability of in person appointments. The patient expressed understanding and agreed to proceed.  History of Present Illness: Christine Miles is a 58 y.o. female who would like to discuss medication follow-up, depression and HTN   Patient has not been seen in the office since 02/07/2019.  At that point, we started new medications for depression as well as high blood pressure, patient did not follow-up as directed for nurse visit blood pressure check 1 week after that visit, or follow-up with me 4 to 6 weeks after that visit to recheck depression.  HTN: Patient had been checking her blood pressure at pharmacy.  Valsartan HCTZ 80-12.5 was sent for 90-day supply on 02/14/2019. Looks like we sent separate Rx for HCT and valsartan 06/2019, pt states she never got the valsartan d/t backorder but we never got any notification of a problem from the pharmacy. She is taking HCTZ 12.5 mg daily   BP Readings from Last 3 Encounters:  02/14/19 (!) 174/107  09/26/18 (!) 150/88  08/07/15 130/88   Wt Readings from Last 3 Encounters:  07/24/19 167 lb (75.8 kg)  02/14/19 163 lb 3.2 oz (74 kg)  09/26/18 163 lb 3.2 oz (74 kg)    Depression: Zoloft 25 mg was last sent 07/09/2019 for 30 days. Skips days here and there when she forgets but overall no issues.   Depression screen [facility] 2/9 07/24/2019 09/26/2018 05/22/2015  Decreased Interest 0 2 0  Down, Depressed, Hopeless 0 2 0  PHQ - 2 Score 0 4 0  Altered sleeping - 3 -  Tired, decreased energy - 2 -  Change in appetite - 2 -  Feeling bad or failure about yourself  - 1 -  Trouble concentrating - 1 -  Moving  slowly or fidgety/restless - 2 -  Suicidal thoughts - 0 -  PHQ-9 Score - 15 -  Difficult doing work/chores - Somewhat difficult -   GAD 7 : Generalized Anxiety Score 07/24/2019 09/26/2018  Nervous, Anxious, on Edge 0 0  Control/stop worrying 0 0  Worry too much - different things 0 1  Trouble relaxing 0 1  Restless 1 1  Easily annoyed or irritable 1 1  Afraid - awful might happen 0 0  Total GAD 7 Score 2 4  Anxiety Difficulty Not difficult at all Somewhat difficult           Observations/Objective: Wt 167 lb (75.8 kg)   BMI 28.67 kg/m  BP Readings from Last 3 Encounters:  02/14/19 (!) 174/107  09/26/18 (!) 150/88  08/07/15 130/88   Exam: Normal Speech.  NAD  Lab and Radiology Results No results found for this or any previous visit (from the past 72 hour(s)). No results found.     Assessment and Plan: 58 y.o. female with The primary encounter diagnosis was Labile blood pressure. A diagnosis of Depression, major, single episode, mild ([place]) was also pertinent to this visit.   PDMP not reviewed this encounter. No orders of the defined types were placed in this encounter.  Meds ordered this encounter  Medications  . DISCONTD: sertraline (ZOLOFT) 25 MG tablet  Sig: Take 1 tablet (25 mg total) by mouth daily.    Dispense:  90 tablet    Refill:  3    No refills. Pt is overdue for a f/u appt w/PCP.  . hydrochlorothiazide (HYDRODIURIL) 25 MG tablet    Sig: Take 0.5 tablets (12.5 mg total) by mouth daily.    Dispense:  90 tablet    Refill:  0  . sertraline (ZOLOFT) 25 MG tablet    Sig: Take 1 tablet (25 mg total) by mouth daily.    Dispense:  90 tablet    Refill:  3      Follow Up Instructions: Return in about 4 weeks (around 08/21/2019) for LAB VISIT - ORDERS ARE IN .    I discussed the assessment and treatment plan with the patient. The patient was provided an opportunity to ask questions and all were answered. The patient agreed with the plan and  demonstrated an understanding of the instructions.   The patient was advised to call back or seek an in-person evaluation if any new concerns, if symptoms worsen or if the condition fails to improve as anticipated.  25 minutes of non-face-to-face time was provided during this encounter.                      Historical information moved to improve visibility of documentation.  Past Medical History:  Diagnosis Date  . Anemia   . Sarcoidosis    Past Surgical History:  Procedure Laterality Date  . BREAST CYST EXCISION Right 1980   age 58  . CESAREAN SECTION  1993  . CYST EXCISION  2001   behind ear  . HERNIA REPAIR  1988   navel  . PARTIAL HYSTERECTOMY  2007   Social History   Tobacco Use  . Smoking status: Never Smoker  . Smokeless tobacco: Never Used  Substance Use Topics  . Alcohol use: No    Alcohol/week: 0.0 standard drinks   family history includes Breast cancer in her maternal aunt and mother; Cancer in her mother; Diabetes in her father.  Medications: Current Outpatient Medications  Medication Sig Dispense Refill  . Cholecalciferol (PA VITAMIN D-3 GUMMY PO) Take by mouth. Take 3000 /u by mouth daily    . Melatonin 300 MCG TABS Take 3-4 tablets by mouth for sleep    . Multiple Vitamins-Minerals (MULTIVITAMIN GUMMIES WOMENS) CHEW Take 2 gummies by mouth daily    . Omega-3 Fatty Acids (OMEGA-3 PO) Take 100 mg by mouth daily    . sertraline (ZOLOFT) 25 MG tablet Take 1 tablet (25 mg total) by mouth daily. 90 tablet 3  . Glucosamine HCl-MSM (MSM GLUCOSAMINE PO) Take 2 capsules by mouth daily    . hydrochlorothiazide (HYDRODIURIL) 25 MG tablet Take 0.5 tablets (12.5 mg total) by mouth daily. 90 tablet 0  . NON FORMULARY Native Remedies (AllergyEase  Scent & Phenol) spray 2-3 times in mouth daily     No current facility-administered medications for this visit.    No Known Allergies  PDMP not reviewed this encounter. No orders of the defined types  were placed in this encounter.  Meds ordered this encounter  Medications  . DISCONTD: sertraline (ZOLOFT) 25 MG tablet    Sig: Take 1 tablet (25 mg total) by mouth daily.    Dispense:  90 tablet    Refill:  3    No refills. Pt is overdue for a f/u appt w/PCP.  . hydrochlorothiazide (HYDRODIURIL) 25 MG tablet  Sig: Take 0.5 tablets (12.5 mg total) by mouth daily.    Dispense:  90 tablet    Refill:  0  . sertraline (ZOLOFT) 25 MG tablet    Sig: Take 1 tablet (25 mg total) by mouth daily.    Dispense:  90 tablet    Refill:  3

## 2019-08-07 ENCOUNTER — Telehealth: Payer: Self-pay | Admitting: Osteopathic Medicine

## 2019-08-07 MED ORDER — VALSARTAN 80 MG PO TABS: 80.0000 mg | ORAL_TABLET | Freq: Every day | ORAL | 0 refills | Status: DC

## 2019-08-07 NOTE — Telephone Encounter (Signed)
-----   Message from Willy Eddy, RN sent at 08/06/2019  9:57 AM EDT ----- Regarding: Epic error Good morning Dr. Sheppard Coil,   Do to an issue with Epic the refill order did not go through or was auto cancelled.  We are working with Epic and hope to have this issue resolved by 08/08/19.  We apologize for any inconvenience this may cause.  Please place a new order for the medication.  VALSARTAN 80 MG PO TABS   Thank you,  Zebedee Iba, RN, BSN Ambulatory Analyst

## 2019-09-18 ENCOUNTER — Other Ambulatory Visit: Payer: Self-pay | Admitting: Osteopathic Medicine

## 2019-09-18 DIAGNOSIS — Z1231 Encounter for screening mammogram for malignant neoplasm of breast: Secondary | ICD-10-CM

## 2019-10-10 ENCOUNTER — Ambulatory Visit (INDEPENDENT_AMBULATORY_CARE_PROVIDER_SITE_OTHER): Payer: No Typology Code available for payment source

## 2019-10-10 ENCOUNTER — Other Ambulatory Visit: Payer: Self-pay

## 2019-10-10 DIAGNOSIS — Z1231 Encounter for screening mammogram for malignant neoplasm of breast: Secondary | ICD-10-CM

## 2019-11-05 ENCOUNTER — Encounter: Payer: Self-pay | Admitting: Osteopathic Medicine

## 2019-11-05 ENCOUNTER — Ambulatory Visit (INDEPENDENT_AMBULATORY_CARE_PROVIDER_SITE_OTHER): Payer: No Typology Code available for payment source | Admitting: Osteopathic Medicine

## 2019-11-05 DIAGNOSIS — M545 Low back pain, unspecified: Secondary | ICD-10-CM

## 2019-11-05 DIAGNOSIS — G8929 Other chronic pain: Secondary | ICD-10-CM

## 2019-11-05 DIAGNOSIS — N951 Menopausal and female climacteric states: Secondary | ICD-10-CM | POA: Insufficient documentation

## 2019-11-05 MED ORDER — CYCLOBENZAPRINE HCL 10 MG PO TABS: 5.0000 mg | ORAL_TABLET | Freq: Three times a day (TID) | ORAL | 0 refills | Status: DC | PRN

## 2019-11-05 MED ORDER — NAPROXEN 500 MG PO TABS: 500.0000 mg | ORAL_TABLET | Freq: Two times a day (BID) | ORAL | 1 refills | Status: DC

## 2019-11-05 MED ORDER — PREDNISONE 20 MG PO TABS: 20.0000 mg | ORAL_TABLET | Freq: Two times a day (BID) | ORAL | 0 refills | Status: DC

## 2019-11-05 MED ORDER — ESTRADIOL 1 MG PO TABS: 1.0000 mg | ORAL_TABLET | Freq: Every day | ORAL | 1 refills | Status: DC

## 2019-11-05 NOTE — Progress Notes (Signed)
Virtual Visit via Video (App used: Doximity) Note  I connected with      Christine Miles on 11/05/19 at 12:36 PM by a telemedicine application and verified that I am speaking with the correct person using two identifiers.  Patient is AT HOME I am in office   I discussed the limitations of evaluation and management by telemedicine and the availability of in person appointments. The patient expressed understanding and agreed to proceed.  History of Present Illness: Christine Miles is a 58 y.o. female who would like to discuss BACK PAIN, MENOPAUSE   Back pain . Location/Quality: lower back, feels like pressure, worse on the left  . Severity, Duration: few months getting worse over that time  . Modifying factors: worse w/ standing, has done some stretching which helps   Menopause  . Location:  . Quality: can't sleep due to hot flashes  . Severity: . Duration: . Timing: . Context: . Modifying factors: . Assoc signs/symptoms:  HTN: Above goal last visit 01/2019, last was 124/79 last week.  Pt had been taking BP at pharmacy, no longer an option d/t COVID pandemic. Should be on Valsartan + HCT.     Past Surgical History:  Procedure Laterality Date  . BREAST CYST EXCISION Right 1980   age 80  . CESAREAN SECTION  1993  . CYST EXCISION  2001   behind ear  . HERNIA REPAIR  1988   navel  . PARTIAL HYSTERECTOMY  2007    .  Observations/Objective: There were no vitals taken for this visit. BP Readings from Last 3 Encounters:  02/14/19 (!) 174/107  09/26/18 (!) 150/88  08/07/15 130/88   Exam: Normal Speech.  NAD  Lab and Radiology Results No results found for this or any previous visit (from the past 72 hour(s)). No results found.     Assessment and Plan: 58 y.o. female with The primary encounter diagnosis was Chronic left-sided low back pain, unspecified whether sciatica present. A diagnosis of Hot flashes, menopausal was also pertinent to this  visit.   PDMP not reviewed this encounter. Orders Placed This Encounter  Procedures  . DG Lumbar Spine Complete    Order Specific Question:   Reason for Exam (SYMPTOM  OR DIAGNOSIS REQUIRED)    Answer:   pain    Order Specific Question:   Is patient pregnant?    Answer:   No    Order Specific Question:   Preferred imaging location?    Answer:   [facility]    Order Specific Question:   Radiology Contrast Protocol - do NOT remove file path    Answer:   [email or web address]   Meds ordered this encounter  Medications  . predniSONE (DELTASONE) 20 MG tablet    Sig: Take 1 tablet (20 mg total) by mouth 2 (two) times daily with a meal.    Dispense:  10 tablet    Refill:  0  . naproxen (NAPROSYN) 500 MG tablet    Sig: Take 1 tablet (500 mg total) by mouth 2 (two) times daily with a meal. Take every day for one week, then use as needed after that    Dispense:  60 tablet    Refill:  1  . cyclobenzaprine (FLEXERIL) 10 MG tablet    Sig: Take 0.5-1 tablets (5-10 mg total) by mouth 3 (three) times daily as needed for muscle spasms. Caution: can cause drowsiness    Dispense:  30 tablet    Refill:  0  .  estradiol (ESTRACE) 1 MG tablet    Sig: Take 1 tablet (1 mg total) by mouth daily.    Dispense:  90 tablet    Refill:  1   Patient Instructions  Plan:   Back pain:  I sent some medications to help treat this. Naprosyn/naproxen anti-inflammatory - 500 mg twice daily for a week, then can use as needed after that for aches and pains Prednisone steroid - 20 mg twice daily with a meal, can take with breakfast and lunch Flexeril/cyclobenzaprine muscle relaxer -10 mg up to 3 times a day as needed.  This may cause some drowsiness, can take half a tablet if desired.  If these do not seem to be helping, please let me know.  I have placed an order to get an x-ray done, you can come into the imaging department on the first floor of our building  anytime.  We can also place a referral for physical therapy, depending on how the Covid numbers are looking over the next couple weeks/months.   Menopause:  I have sent in a prescription for estrogen, 1 mg tablet to take daily.  Let me know whether this is helping or not!   Labs:  I can see the blood work that the hematologist is taking care of.  This includes blood counts but does not include cholesterol screening, sugar screening, liver/kidney function.  Sometime in the next 3 months, we can get blood work done here but let us not worry about it for now.  I would not want to wait too long though, we should be checking kidney function especially, given the medications that you are taking for blood pressure.     Instructions sent via MyChart. If MyChart not available, pt was given option for info via personal e-mail w/ no guarantee of protected health info over unsecured e-mail communication, and MyChart sign-up instructions were sent to patient.   Follow Up Instructions: Return for RECHECK PENDING TREATMENT FOR BACK PAIN & MENOPAUSE / IF WORSE OR CHANGE. ANNUAL IN NEXT 3-6 MOS .    I discussed the assessment and treatment plan with the patient. The patient was provided an opportunity to ask questions and all were answered. The patient agreed with the plan and demonstrated an understanding of the instructions.   The patient was advised to call back or seek an in-person evaluation if any new concerns, if symptoms worsen or if the condition fails to improve as anticipated.  25 minutes of non-face-to-face time was provided during this encounter.      . . . . . . . . . . . . . Marland Kitchen                   Historical information moved to improve visibility of documentation.  Past Medical History:  Diagnosis Date  . Anemia   . Sarcoidosis    Past Surgical History:  Procedure Laterality Date  . BREAST CYST EXCISION Right 1980   age 25  . CESAREAN SECTION  1993   . CYST EXCISION  2001   behind ear  . HERNIA REPAIR  1988   navel  . PARTIAL HYSTERECTOMY  2007   Social History   Tobacco Use  . Smoking status: Never Smoker  . Smokeless tobacco: Never Used  Substance Use Topics  . Alcohol use: No    Alcohol/week: 0.0 standard drinks   family history includes Breast cancer in her maternal aunt and mother; Cancer in her mother; Diabetes in her  father.  Medications: Current Outpatient Medications  Medication Sig Dispense Refill  . Cholecalciferol (PA VITAMIN D-3 GUMMY PO) Take by mouth. Take 3000 /u by mouth daily    . Glucosamine HCl-MSM (MSM GLUCOSAMINE PO) Take 2 capsules by mouth daily    . hydrochlorothiazide (HYDRODIURIL) 25 MG tablet Take 0.5 tablets (12.5 mg total) by mouth daily. 90 tablet 0  . Melatonin 300 MCG TABS Take 3-4 tablets by mouth for sleep    . Multiple Vitamins-Minerals (MULTIVITAMIN GUMMIES WOMENS) CHEW Take 2 gummies by mouth daily    . NON FORMULARY Native Remedies (AllergyEase  Scent & Phenol) spray 2-3 times in mouth daily    . Omega-3 Fatty Acids (OMEGA-3 PO) Take 100 mg by mouth daily    . sertraline (ZOLOFT) 25 MG tablet Take 1 tablet (25 mg total) by mouth daily. 90 tablet 3  . valsartan (DIOVAN) 80 MG tablet Take 1 tablet (80 mg total) by mouth daily. 30 tablet 0  . cyclobenzaprine (FLEXERIL) 10 MG tablet Take 0.5-1 tablets (5-10 mg total) by mouth 3 (three) times daily as needed for muscle spasms. Caution: can cause drowsiness 30 tablet 0  . estradiol (ESTRACE) 1 MG tablet Take 1 tablet (1 mg total) by mouth daily. 90 tablet 1  . naproxen (NAPROSYN) 500 MG tablet Take 1 tablet (500 mg total) by mouth 2 (two) times daily with a meal. Take every day for one week, then use as needed after that 60 tablet 1  . predniSONE (DELTASONE) 20 MG tablet Take 1 tablet (20 mg total) by mouth 2 (two) times daily with a meal. 10 tablet 0   No current facility-administered medications for this visit.    No Known Allergies

## 2019-11-05 NOTE — Patient Instructions (Signed)
Plan:   Back pain:  I sent some medications to help treat this. Naprosyn/naproxen anti-inflammatory - 500 mg twice daily for a week, then can use as needed after that for aches and pains Prednisone steroid - 20 mg twice daily with a meal, can take with breakfast and lunch Flexeril/cyclobenzaprine muscle relaxer -10 mg up to 3 times a day as needed.  This may cause some drowsiness, can take half a tablet if desired.  If these do not seem to be helping, please let me know.  I have placed an order to get an x-ray done, you can come into the imaging department on the first floor of our building anytime.  We can also place a referral for physical therapy, depending on how the Covid numbers are looking over the next couple weeks/months.   Menopause:  I have sent in a prescription for estrogen, 1 mg tablet to take daily.  Let me know whether this is helping or not!   Labs:  I can see the blood work that the hematologist is taking care of.  This includes blood counts but does not include cholesterol screening, sugar screening, liver/kidney function.  Sometime in the next 3 months, we can get blood work done here but let us not worry about it for now.  I would not want to wait too long though, we should be checking kidney function especially, given the medications that you are taking for blood pressure.

## 2020-01-03 ENCOUNTER — Ambulatory Visit (INDEPENDENT_AMBULATORY_CARE_PROVIDER_SITE_OTHER): Payer: No Typology Code available for payment source | Admitting: Nurse Practitioner

## 2020-01-03 ENCOUNTER — Encounter: Payer: Self-pay | Admitting: Nurse Practitioner

## 2020-01-03 ENCOUNTER — Other Ambulatory Visit: Payer: Self-pay

## 2020-01-03 VITALS — BP 144/94 | HR 87 | Temp 98.3°F | Wt 164.0 lb

## 2020-01-03 DIAGNOSIS — S9032XA Contusion of left foot, initial encounter: Secondary | ICD-10-CM | POA: Diagnosis not present

## 2020-01-03 DIAGNOSIS — I10 Essential (primary) hypertension: Secondary | ICD-10-CM | POA: Diagnosis not present

## 2020-01-03 NOTE — Progress Notes (Signed)
Acute Office Visit  Subjective:    Patient ID: Christine Miles, female    DOB: 20-Feb-1961, 59 y.o.   MRN: [id number]  CC: dark spot on heal  HPI Christine Miles is a 59 year old female seen today for discoloration to her left heal after accidentally shaving a calloused area too deep. The injury occurred a few days before Christmas. She reports the area was open and raw, but has since healed over leaving a discoloration to the skin. She denies pain, except when direct and heavy pressure is placed specifically on the area. She denies any redness, irritation, or warmth to the area.   HYPERTENSION The patients blood pressure is above goal in the office today. She reports this is typical and she has been told that she has [place] Hypertension. She monitors her blood pressure regularly at home and reports it is always within the goal range. She also reports that it is time for her to have follow-up labs.  Hypertension status: reports well controlled at home- but is always elevated in the office  Satisfied with current treatment? yes Duration of hypertension: months BP monitoring frequency:  weekly BP range: 110's-120's / 70's-80's at home BP medication side effects:  no Medication compliance: excellent compliance Aspirin: no Recurrent headaches: no Visual changes: no Palpitations: no Dyspnea: no Chest pain: no Lower extremity edema: no Dizzy/lightheaded: no    Past Medical History:  Diagnosis Date  . Anemia   . Sarcoidosis     Past Surgical History:  Procedure Laterality Date  . BREAST CYST EXCISION Right 1980   age 47  . CESAREAN SECTION  1993  . CYST EXCISION  2001   behind ear  . HERNIA REPAIR  1988   navel  . PARTIAL HYSTERECTOMY  2007    Family History  Problem Relation Age of Onset  . Diabetes Father   . Cancer Mother   . Breast cancer Mother   . Breast cancer Maternal Aunt     Social History   Socioeconomic History  . Marital status: Married    Spouse  name: Not on file  . Number of children: Not on file  . Years of education: Not on file  . Highest education level: Not on file  Occupational History  . Not on file  Tobacco Use  . Smoking status: Never Smoker  . Smokeless tobacco: Never Used  Substance and Sexual Activity  . Alcohol use: No    Alcohol/week: 0.0 standard drinks  . Drug use: No  . Sexual activity: Not on file  Other Topics Concern  . Not on file  Social History Narrative   Diet:      Do you drink/ eat things with caffeine? Yes, occassionally      Marital status:  Marrried                             What year were you married ?  1990      Do you live in a house, apartment,assistred living, condo, trailer, etc.)? House      Is it one or more stories?       How many persons live in your home ?      Do you have any pets in your home ?(please list)  No      Current or past profession:      Do you exercise?  Yes  Type & how often: 3-4 x weekly      Do you have a living will?  Yes       Do you have a DNR form?                       If not, do you want to discuss one? No      Do you have signed POA?HPOA forms?                 If so, please bring to your        appointment      Social Determinants of Health   Financial Resource Strain:   . Difficulty of Paying Living Expenses: Not on file  Food Insecurity:   . Worried About Charity fundraiser in the Last Year: Not on file  . Ran Out of Food in the Last Year: Not on file  Transportation Needs:   . Lack of Transportation (Medical): Not on file  . Lack of Transportation (Non-Medical): Not on file  Physical Activity:   . Days of Exercise per Week: Not on file  . Minutes of Exercise per Session: Not on file  Stress:   . Feeling of Stress : Not on file  Social Connections:   . Frequency of Communication with Friends and Family: Not on file  . Frequency of Social Gatherings with Friends and Family: Not on file  . Attends  Religious Services: Not on file  . Active Member of Clubs or Organizations: Not on file  . Attends Archivist Meetings: Not on file  . Marital Status: Not on file  Intimate Partner Violence:   . Fear of Current or Ex-Partner: Not on file  . Emotionally Abused: Not on file  . Physically Abused: Not on file  . Sexually Abused: Not on file    Outpatient Medications Prior to Visit  Medication Sig Dispense Refill  . Cholecalciferol (PA VITAMIN D-3 GUMMY PO) Take by mouth. Take 3000 /u by mouth daily    . cyclobenzaprine (FLEXERIL) 10 MG tablet Take 0.5-1 tablets (5-10 mg total) by mouth 3 (three) times daily as needed for muscle spasms. Caution: can cause drowsiness 30 tablet 0  . estradiol (ESTRACE) 1 MG tablet Take 1 tablet (1 mg total) by mouth daily. 90 tablet 1  . Glucosamine HCl-MSM (MSM GLUCOSAMINE PO) Take 2 capsules by mouth daily    . hydrochlorothiazide (HYDRODIURIL) 25 MG tablet Take 0.5 tablets (12.5 mg total) by mouth daily. 90 tablet 0  . Melatonin 300 MCG TABS Take 3-4 tablets by mouth for sleep    . Multiple Vitamins-Minerals (MULTIVITAMIN GUMMIES WOMENS) CHEW Take 2 gummies by mouth daily    . naproxen (NAPROSYN) 500 MG tablet Take 1 tablet (500 mg total) by mouth 2 (two) times daily with a meal. Take every day for one week, then use as needed after that 60 tablet 1  . NON FORMULARY Native Remedies (AllergyEase  Scent & Phenol) spray 2-3 times in mouth daily    . Omega-3 Fatty Acids (OMEGA-3 PO) Take 100 mg by mouth daily    . predniSONE (DELTASONE) 20 MG tablet Take 1 tablet (20 mg total) by mouth 2 (two) times daily with a meal. 10 tablet 0  . sertraline (ZOLOFT) 25 MG tablet Take 1 tablet (25 mg total) by mouth daily. 90 tablet 3  . valsartan (DIOVAN) 80 MG tablet Take 1 tablet (80 mg total) by mouth daily. Ramos  tablet 0   No facility-administered medications prior to visit.    No Known Allergies  Review of Systems  Constitutional: Negative for chills and  fever.  Eyes: Negative for visual disturbance.  Respiratory: Negative for chest tightness and shortness of breath.   Cardiovascular: Negative for chest pain, palpitations and leg swelling.  Skin: Positive for wound.       Well healed wound with residual discoloration to left heal   Neurological: Negative for dizziness, light-headedness and headaches.  Hematological: Bruises/bleeds easily.       Objective:    Physical Exam Vitals reviewed.  Constitutional:      Appearance: Normal appearance.  Cardiovascular:     Rate and Rhythm: Normal rate and regular rhythm.     Pulses: Normal pulses.     Heart sounds: Normal heart sounds.  Pulmonary:     Effort: Pulmonary effort is normal.     Breath sounds: Normal breath sounds.  Abdominal:     General: Abdomen is flat. Bowel sounds are normal.     Palpations: Abdomen is soft.  Musculoskeletal:     Right lower leg: No edema.     Left lower leg: No edema.       Feet:  Feet:     Left foot:     Skin integrity: No ulcer or skin breakdown.  Skin:    General: Skin is warm and dry.     Capillary Refill: Capillary refill takes less than 2 seconds.  Neurological:     Mental Status: She is alert and oriented to person, place, and time.  Psychiatric:        Mood and Affect: Mood normal.        Behavior: Behavior normal.     There were no vitals taken for this visit. Wt Readings from Last 3 Encounters:  07/24/19 167 lb (75.8 kg)  02/14/19 163 lb 3.2 oz (74 kg)  09/26/18 163 lb 3.2 oz (74 kg)    Health Maintenance Due  Topic Date Due  . Hepatitis C Screening  1961-09-17  . HIV Screening  02/10/1978  . COLONOSCOPY  02/10/2013  . PAP SMEAR-Modifier  08/21/2016    There are no preventive care reminders to display for this patient.   Lab Results  Component Value Date   TSH 1.08 09/26/2018   Lab Results  Component Value Date   WBC 3.1 (L) 09/26/2018   HGB 14.0 09/26/2018   HCT 41.5 09/26/2018   MCV 91.8 09/26/2018   PLT 243  09/26/2018   Lab Results  Component Value Date   NA 136 09/26/2018   K 4.5 09/26/2018   CO2 28 09/26/2018   GLUCOSE 77 09/26/2018   BUN 11 09/26/2018   CREATININE 0.85 09/26/2018   BILITOT 1.9 (H) 09/26/2018   AST 15 09/26/2018   ALT 11 09/26/2018   PROT 7.4 09/26/2018   CALCIUM 9.8 09/26/2018   Lab Results  Component Value Date   CHOL 148 09/26/2018   Lab Results  Component Value Date   HDL 57 09/26/2018   Lab Results  Component Value Date   LDLCALC 78 09/26/2018   Lab Results  Component Value Date   TRIG 52 09/26/2018   Lab Results  Component Value Date   CHOLHDL 2.6 09/26/2018   No results found for: HGBA1C     Assessment & Plan:   1. Traumatic ecchymosis of left foot, initial encounter Ecchymosis and slight skin thickening to left heal due to injury. Appears to be healing  well. The discoloration should resolve over time as the damaged skin continues to move to the surface and slough. Pt to RTC if skin breakdown or wound appears, the area becomes painful, or problems persist.   2. Benign essential HTN BP not at goal today in clinic. Pt to continue to monitor her BP at home and follow-up with Dr. Lyn Hollingshead if levels are not within goal at home. She does not need refills today. Will get CMP to monitor metabolic function in the setting of thiazide diuretic treatment. Instructions provided on DASH diet.   - COMPLETE METABOLIC PANEL WITH GFR  Tollie Eth, NP

## 2020-01-03 NOTE — Patient Instructions (Signed)
Thank you allowing me to care for you today. It was a pleasure meeting you.  Your heel looks like it is well healed. I believe that the discoloration could be residual bruising or possibly due to the thickened skin that formed as it healed. I do not see any signs of infection and the skin is well closed over. This should lighten as the area heals more. If you begin to have pain, swelling, or increased redness to the area let us know.   Keep a close eye on your blood pressure at home. If you notice the numbers above 130/80, please let us know. We want to keep it lower than this to reduce your risks of complications.    DASH Eating Plan DASH stands for "Dietary Approaches to Stop Hypertension." The DASH eating plan is a healthy eating plan that has been shown to reduce high blood pressure (hypertension). It may also reduce your risk for type 2 diabetes, heart disease, and stroke. The DASH eating plan may also help with weight loss. What are tips for following this plan?  General guidelines  Avoid eating more than 2,300 mg (milligrams) of salt (sodium) a day. If you have hypertension, you may need to reduce your sodium intake to 1,500 mg a day.  Limit alcohol intake to no more than 1 drink a day for nonpregnant women and 2 drinks a day for men. One drink equals 12 oz of beer, 5 oz of wine, or 1 oz of hard liquor.  Work with your health care provider to maintain a healthy body weight or to lose weight. Ask what an ideal weight is for you.  Get at least 30 minutes of exercise that causes your heart to beat faster (aerobic exercise) most days of the week. Activities may include walking, swimming, or biking.  Work with your health care provider or diet and nutrition specialist (dietitian) to adjust your eating plan to your individual calorie needs. Reading food labels   Check food labels for the amount of sodium per serving. Choose foods with less than 5 percent of the Daily Value of sodium.  Generally, foods with less than 300 mg of sodium per serving fit into this eating plan.  To find whole grains, look for the word "whole" as the first word in the ingredient list. Shopping  Buy products labeled as "low-sodium" or "no salt added."  Buy fresh foods. Avoid canned foods and premade or frozen meals. Cooking  Avoid adding salt when cooking. Use salt-free seasonings or herbs instead of table salt or sea salt. Check with your health care provider or pharmacist before using salt substitutes.  Do not fry foods. Cook foods using healthy methods such as baking, boiling, grilling, and broiling instead.  Cook with heart-healthy oils, such as olive, canola, soybean, or sunflower oil. Meal planning  Eat a balanced diet that includes: ? 5 or more servings of fruits and vegetables each day. At each meal, try to fill half of your plate with fruits and vegetables. ? Up to 6-8 servings of whole grains each day. ? Less than 6 oz of lean meat, poultry, or fish each day. A 3-oz serving of meat is about the same size as a deck of cards. One egg equals 1 oz. ? 2 servings of low-fat dairy each day. ? A serving of nuts, seeds, or beans 5 times each week. ? Heart-healthy fats. Healthy fats called Omega-3 fatty acids are found in foods such as flaxseeds and coldwater fish, like sardines,  salmon, and mackerel.  Limit how much you eat of the following: ? Canned or prepackaged foods. ? Food that is high in trans fat, such as fried foods. ? Food that is high in saturated fat, such as fatty meat. ? Sweets, desserts, sugary drinks, and other foods with added sugar. ? Full-fat dairy products.  Do not salt foods before eating.  Try to eat at least 2 vegetarian meals each week.  Eat more home-cooked food and less restaurant, buffet, and fast food.  When eating at a restaurant, ask that your food be prepared with less salt or no salt, if possible. What foods are recommended? The items listed may not  be a complete list. Talk with your dietitian about what dietary choices are best for you. Grains Whole-grain or whole-wheat bread. Whole-grain or whole-wheat pasta. Brown rice. [place] Morrow. Bulgur. Whole-grain and low-sodium cereals. Pita bread. Low-fat, low-sodium crackers. Whole-wheat flour tortillas. Vegetables Fresh or frozen vegetables (raw, steamed, roasted, or grilled). Low-sodium or reduced-sodium tomato and vegetable juice. Low-sodium or reduced-sodium tomato sauce and tomato paste. Low-sodium or reduced-sodium canned vegetables. Fruits All fresh, dried, or frozen fruit. Canned fruit in natural juice (without added sugar). Meat and other protein foods Skinless chicken or [place]. Ground chicken or [place]. Pork with fat trimmed off. Fish and seafood. Egg whites. Dried beans, peas, or lentils. Unsalted nuts, nut butters, and seeds. Unsalted canned beans. Lean cuts of beef with fat trimmed off. Low-sodium, lean deli meat. Dairy Low-fat (1%) or fat-free (skim) milk. Fat-free, low-fat, or reduced-fat cheeses. Nonfat, low-sodium ricotta or cottage cheese. Low-fat or nonfat yogurt. Low-fat, low-sodium cheese. Fats and oils Soft margarine without trans fats. Vegetable oil. Low-fat, reduced-fat, or light mayonnaise and salad dressings (reduced-sodium). Canola, safflower, olive, soybean, and sunflower oils. Avocado. Seasoning and other foods Herbs. Spices. Seasoning mixes without salt. Unsalted popcorn and pretzels. Fat-free sweets. What foods are not recommended? The items listed may not be a complete list. Talk with your dietitian about what dietary choices are best for you. Grains Baked goods made with fat, such as croissants, muffins, or some breads. Dry pasta or rice meal packs. Vegetables Creamed or fried vegetables. Vegetables in a cheese sauce. Regular canned vegetables (not low-sodium or reduced-sodium). Regular canned tomato sauce and paste (not low-sodium or reduced-sodium). Regular  tomato and vegetable juice (not low-sodium or reduced-sodium). Angie Fava. Olives. Fruits Canned fruit in a light or heavy syrup. Fried fruit. Fruit in cream or butter sauce. Meat and other protein foods Fatty cuts of meat. Ribs. Fried meat. Berniece Salines. Sausage. Bologna and other processed lunch meats. Salami. Fatback. Hotdogs. Bratwurst. Salted nuts and seeds. Canned beans with added salt. Canned or smoked fish. Whole eggs or egg yolks. Chicken or [place] with skin. Dairy Whole or 2% milk, cream, and half-and-half. Whole or full-fat cream cheese. Whole-fat or sweetened yogurt. Full-fat cheese. Nondairy creamers. Whipped toppings. Processed cheese and cheese spreads. Fats and oils Butter. Stick margarine. Lard. Shortening. Ghee. Bacon fat. Tropical oils, such as coconut, palm kernel, or palm oil. Seasoning and other foods Salted popcorn and pretzels. Onion salt, garlic salt, seasoned salt, table salt, and sea salt. Worcestershire sauce. Tartar sauce. Barbecue sauce. Teriyaki sauce. Soy sauce, including reduced-sodium. Steak sauce. Canned and packaged gravies. Fish sauce. Oyster sauce. Cocktail sauce. Horseradish that you find on the shelf. Ketchup. Mustard. Meat flavorings and tenderizers. Bouillon cubes. Hot sauce and Tabasco sauce. Premade or packaged marinades. Premade or packaged taco seasonings. Relishes. Regular salad dressings. Where to find more information:  [employer]  Heart, Lung, and Blood Institute: https://wilson-eaton.com/  American Heart Association: www.heart.org Summary  The DASH eating plan is a healthy eating plan that has been shown to reduce high blood pressure (hypertension). It may also reduce your risk for type 2 diabetes, heart disease, and stroke.  With the DASH eating plan, you should limit salt (sodium) intake to 2,300 mg a day. If you have hypertension, you may need to reduce your sodium intake to 1,500 mg a day.  When on the DASH eating plan, aim to eat more fresh fruits and  vegetables, whole grains, lean proteins, low-fat dairy, and heart-healthy fats.  Work with your health care provider or diet and nutrition specialist (dietitian) to adjust your eating plan to your individual calorie needs. This information is not intended to replace advice given to you by your health care provider. Make sure you discuss any questions you have with your health care provider. Document Revised: 11/17/2017 Document Reviewed: 11/28/2016 Elsevier Patient Education  2020 [employer].

## 2020-01-04 LAB — COMPLETE METABOLIC PANEL WITH GFR
AG Ratio: 1.6 (calc) (ref 1.0–2.5)
ALT: 16 U/L (ref 6–29)
AST: 20 U/L (ref 10–35)
Albumin: 4.5 g/dL (ref 3.6–5.1)
Alkaline phosphatase (APISO): 62 U/L (ref 37–153)
BUN: 16 mg/dL (ref 7–25)
CO2: 32 mmol/L (ref 20–32)
Calcium: 10.1 mg/dL (ref 8.6–10.4)
Chloride: 100 mmol/L (ref 98–110)
Creat: 0.85 mg/dL (ref 0.50–1.05)
GFR, Est African American: 89 mL/min/{1.73_m2} (ref >=60)
GFR, Est Non African American: 77 mL/min/{1.73_m2} (ref >=60)
Globulin: 2.8 g/dL (calc) (ref 1.9–3.7)
Glucose, Bld: 94 mg/dL (ref 65–99)
Potassium: 3.9 mmol/L (ref 3.5–5.3)
Sodium: 138 mmol/L (ref 135–146)
Total Bilirubin: 0.8 mg/dL (ref 0.2–1.2)
Total Protein: 7.3 g/dL (ref 6.1–8.1)

## 2020-04-26 IMAGING — MG DIGITAL SCREENING BILATERAL MAMMOGRAM WITH TOMO AND CAD
8 series · 9 of 24 positions shown · non-contrast
Comparison: Previous exam(s).

CLINICAL DATA: Screening.

EXAM:
DIGITAL SCREENING BILATERAL MAMMOGRAM WITH TOMO AND CAD

[L MLO synth-2D]
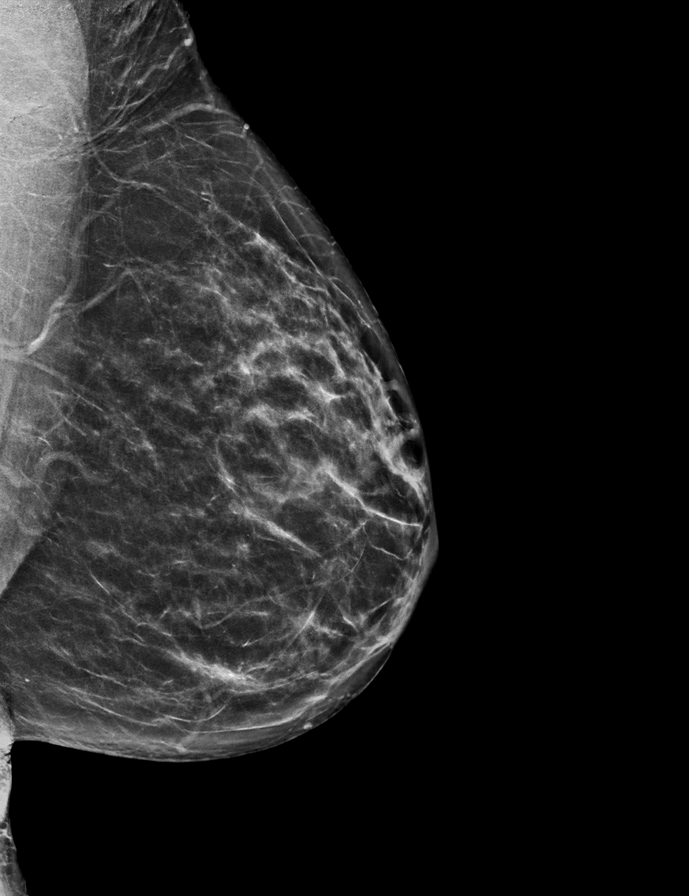

[R MLO synth-2D]
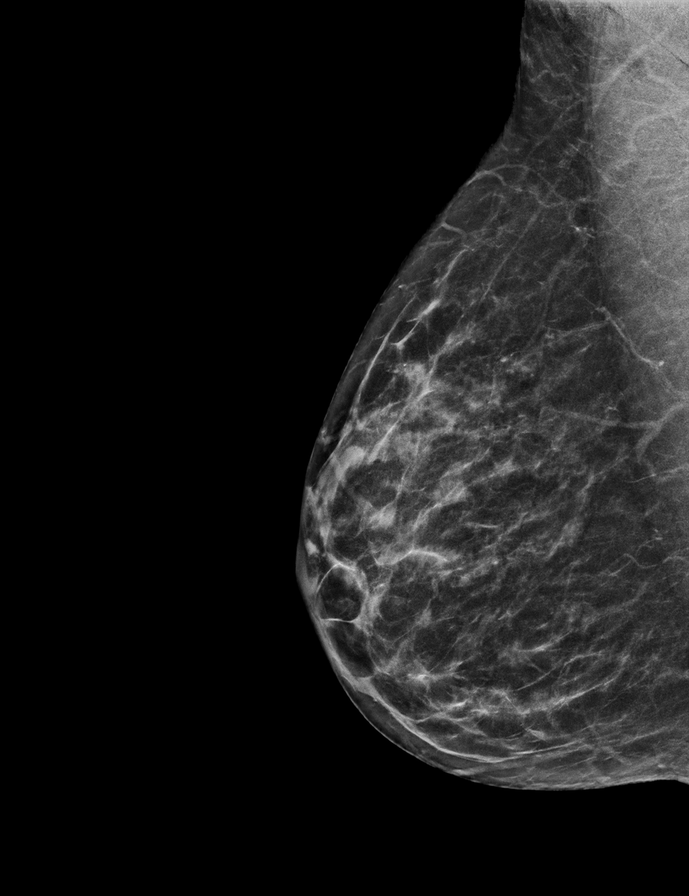

[L CC synth-2D]
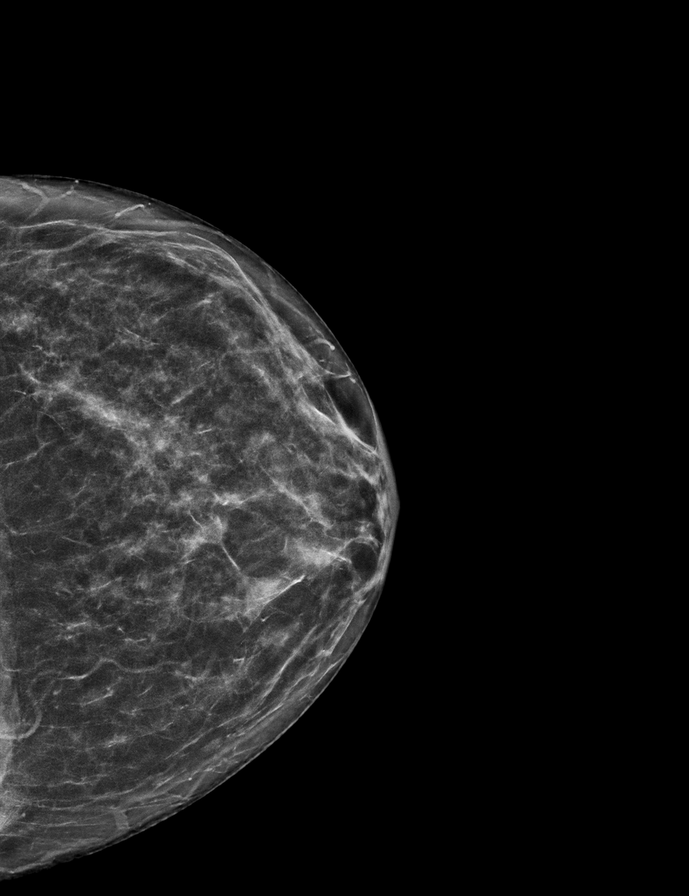

[R CC synth-2D]
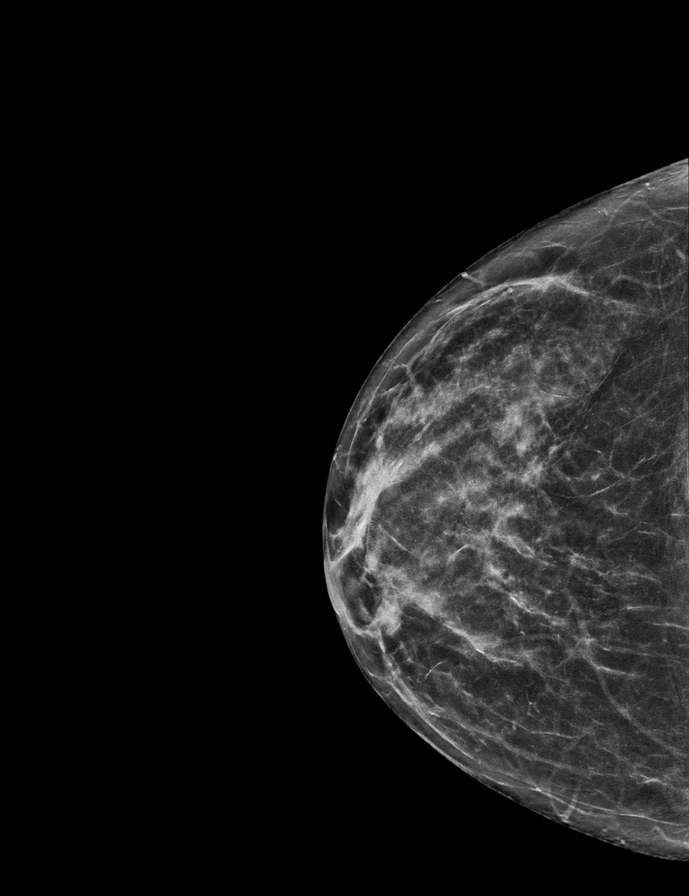

[L CC tomo · 2 of 59 frames shown]
[frame 20/59]
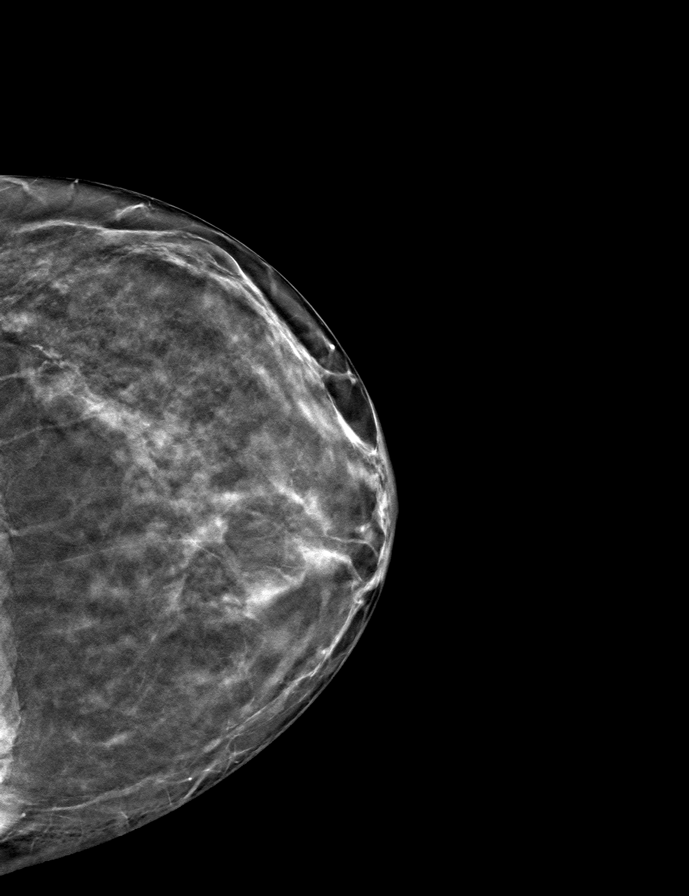
[frame 30/59]
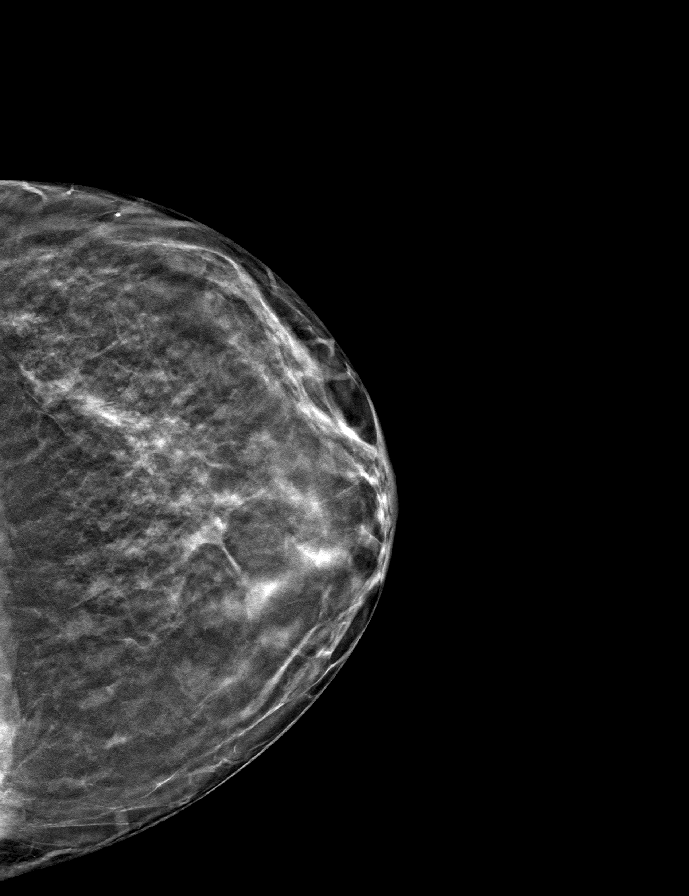

[R CC tomo · tomo slice 29/57.0]
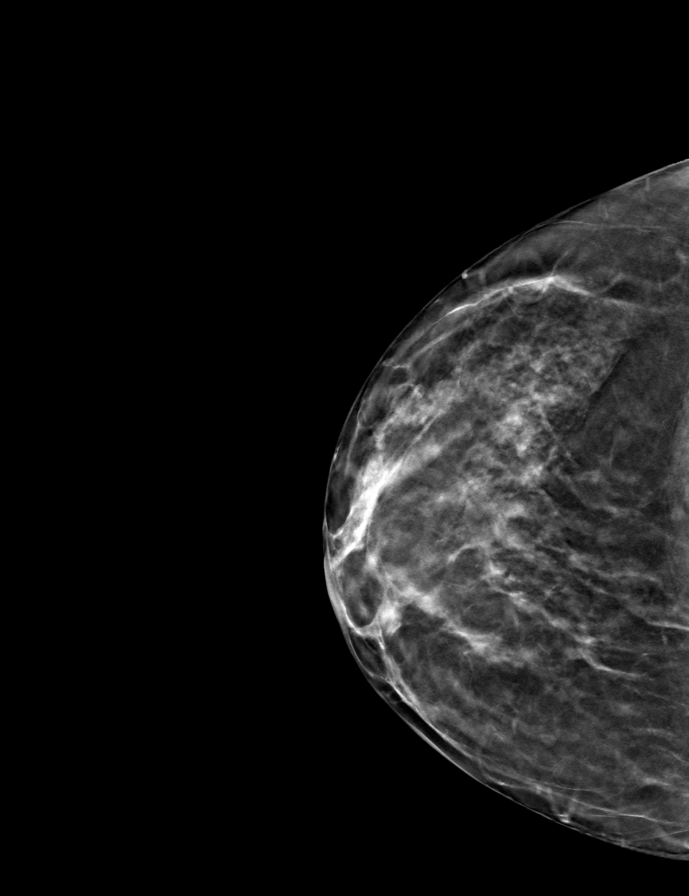

[L MLO tomo · tomo slice 31/61.0]
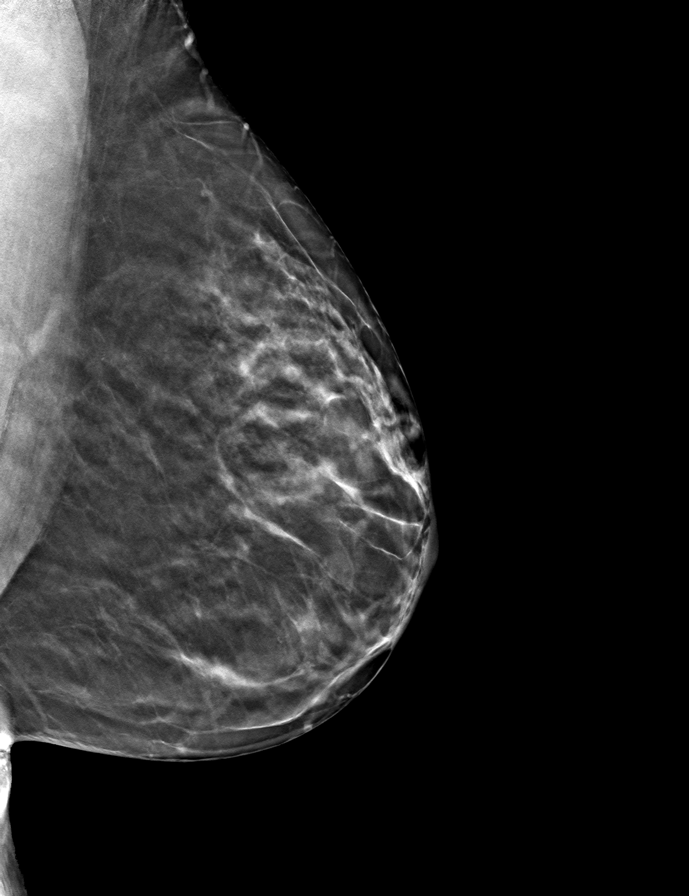

[R MLO tomo · tomo slice 29/56.0]
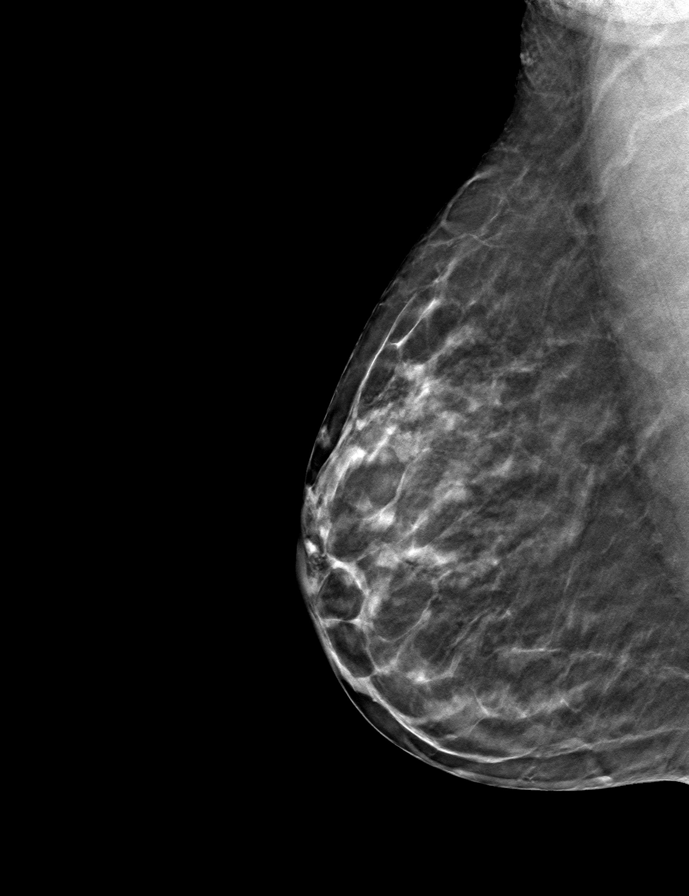

[9 of 24 positions shown; findings below may reference images not displayed]

ACR Breast Density Category c: The breast tissue is heterogeneously
dense, which may obscure small masses.
FINDINGS: There are no findings suspicious for malignancy. Images were
processed with CAD.
IMPRESSION: No mammographic evidence of malignancy. A result letter of this
screening mammogram will be mailed directly to the patient.

RECOMMENDATION:
Screening mammogram in one year. (Code:[id number]-[id number])

BI-RADS CATEGORY  1: Negative.

## 2020-04-28 ENCOUNTER — Other Ambulatory Visit: Payer: Self-pay

## 2020-04-28 ENCOUNTER — Telehealth: Payer: Self-pay

## 2020-04-28 DIAGNOSIS — G8929 Other chronic pain: Secondary | ICD-10-CM

## 2020-04-28 DIAGNOSIS — M545 Low back pain: Secondary | ICD-10-CM

## 2020-04-28 NOTE — Telephone Encounter (Signed)
Orer for Xray was placed but iI last saw her 6 mos ago Needs revaluation - sports med visit advised or pt can see me

## 2020-04-28 NOTE — Progress Notes (Signed)
Opened in error

## 2020-04-28 NOTE — Telephone Encounter (Signed)
Pt called stating she continues to have chronic lower back pain. Pt mentioned that provider suggested an X-ray if symptoms do not improve with cyclobenzaprine and naproxen. Referral pended for review.

## 2020-04-28 NOTE — Telephone Encounter (Signed)
Pt has been updated or referral and provider's note. Agreeable with provider's plan. Pt has an appt scheduled with Sports Medicine on 05/04/20. No other inquiries during call.

## 2020-05-01 NOTE — Telephone Encounter (Signed)
Unable to sign encounter. Referral pending sign off.

## 2020-05-04 ENCOUNTER — Other Ambulatory Visit: Payer: Self-pay

## 2020-05-04 ENCOUNTER — Ambulatory Visit (INDEPENDENT_AMBULATORY_CARE_PROVIDER_SITE_OTHER): Payer: No Typology Code available for payment source | Admitting: Sports Medicine

## 2020-05-04 ENCOUNTER — Ambulatory Visit (INDEPENDENT_AMBULATORY_CARE_PROVIDER_SITE_OTHER): Payer: No Typology Code available for payment source

## 2020-05-04 DIAGNOSIS — M545 Low back pain, unspecified: Secondary | ICD-10-CM

## 2020-05-04 DIAGNOSIS — G8929 Other chronic pain: Secondary | ICD-10-CM

## 2020-05-04 MED ORDER — MELOXICAM 15 MG PO TABS: ORAL_TABLET | ORAL | 3 refills | Status: DC

## 2020-05-04 NOTE — Progress Notes (Signed)
    Procedures performed today:    None.  Independent interpretation of notes and tests performed by another provider:   None.  Brief History, Exam, Impression, and Recommendations:    Chronic left-sided low back pain This is a very pleasant 59 year old female, she has had months of pain in her low back, left-sided referrable to the sacroiliac joint, worse with standing, extension, better with sitting, flexion, not worse with Valsalva. It refers down into her buttock and thigh but not past the knee, no paresthesias into the foot, no bowel or bladder dysfunction, saddle numbness, constitutional symptoms. I do think her pain is coming from her left sacroiliac joint, adding lumbar spine and SI joint x-rays, we discussed the pathophysiology, discontinue muscle relaxers, switching to meloxicam, aggressive formal PT. If no better we will try an injection, she understands that her lumbar facets are also possible pain generators.    ___________________________________________ Ihor Austin. Benjamin Stain, M.D., ABFM., CAQSM. Primary Care and Sports Medicine [facility]  Adjunct Instructor of Family Medicine  [employer]

## 2020-05-04 NOTE — Assessment & Plan Note (Signed)
This is a very pleasant 59 year old female, she has had months of pain in her low back, left-sided referrable to the sacroiliac joint, worse with standing, extension, better with sitting, flexion, not worse with Valsalva. It refers down into her buttock and thigh but not past the knee, no paresthesias into the foot, no bowel or bladder dysfunction, saddle numbness, constitutional symptoms. I do think her pain is coming from her left sacroiliac joint, adding lumbar spine and SI joint x-rays, we discussed the pathophysiology, discontinue muscle relaxers, switching to meloxicam, aggressive formal PT. If no better we will try an injection, she understands that her lumbar facets are also possible pain generators.

## 2020-05-08 ENCOUNTER — Ambulatory Visit (INDEPENDENT_AMBULATORY_CARE_PROVIDER_SITE_OTHER): Payer: Self-pay | Admitting: Rehabilitative and Restorative Service Providers"

## 2020-05-08 ENCOUNTER — Other Ambulatory Visit: Payer: Self-pay

## 2020-05-08 DIAGNOSIS — M545 Low back pain, unspecified: Secondary | ICD-10-CM

## 2020-05-08 DIAGNOSIS — G8929 Other chronic pain: Secondary | ICD-10-CM

## 2020-05-08 DIAGNOSIS — M6281 Muscle weakness (generalized): Secondary | ICD-10-CM

## 2020-05-08 NOTE — Therapy (Addendum)
[place], [place], [address] Phone: [phone]   Fax:  [phone]  Physical Therapy Evaluation  Patient Details  Name: Christine Miles MRN: [id number] Date of Birth: 12/07/1961 Referring Provider (PT): Aundria Mems, MD   Encounter Date: 05/08/2020  PT End of Session - 05/08/20 1238    Visit Number  1    Number of Visits  6    Date for PT Re-Evaluation  06/19/20    PT Start Time  4492    PT Stop Time  1230    PT Time Calculation (min)  43 min       Past Medical History:  Diagnosis Date  . Anemia   . Sarcoidosis     Past Surgical History:  Procedure Laterality Date  . BREAST CYST EXCISION Right 1980   age 59  . CESAREAN SECTION  1993  . CYST EXCISION  2001   behind ear  . HERNIA REPAIR  1988   navel  . PARTIAL HYSTERECTOMY  2007    There were no vitals filed for this visit.   Subjective Assessment - 05/08/20 1152    Subjective  The patient had onset of L SI/ low back pain in 2020 without known injury.  She has seen a chiropractor without relief.  She was recently prescribed meloxican and feels some improvement with standing tolerance.    Pertinent History  sarcoidosis, HTN    Patient Stated Goals  relief of pain    Currently in Pain?  No/denies    Pain Location  Hip    Pain Descriptors / Indicators  Dull;Aching    Pain Type  Chronic pain    Pain Radiating Towards  can radiate into the leg (improved with meloxican)    Pain Onset  More than a month ago    Pain Frequency  Intermittent    Aggravating Factors   rolling in bed or standing for long periods, tired later in the day    Pain Relieving Factors  meds         [facility] PT Assessment - 05/08/20 1157      Assessment   Medical Diagnosis  L sided chronic low bck pain    Referring Provider (PT)  Aundria Mems, MD    Onset Date/Surgical Date  --   >6 months ago   Prior Therapy  chriropractor      Precautions    Precautions  None      Balance Screen   Has the patient fallen in the past 6 months  No    Has the patient had a decrease in activity level because of a fear of falling?   No    Is the patient reluctant to leave their home because of a fear of falling?   No      Home Film/video editor residence      Prior Function   Level of Independence  Independent      Observation/Other Assessments   Focus on Therapeutic Outcomes (FOTO)   ADD      Sensation   Light Touch  Appears Intact      Posture/Postural Control   Posture/Postural Control  No significant limitations      ROM / Strength   AROM / PROM / Strength  AROM;Strength      AROM   Overall AROM   Deficits    Overall AROM Comments  Pain in L low back (above PSIS region)  with all AROM lumbar spine    AROM Assessment Site  Lumbar    Lumbar Flexion  25% limited    Lumbar Extension  WFLs    Lumbar - Right Side Bend  WFLs    Lumbar - Left Side Bend  WFLs    Lumbar - Right Rotation  WFL    Lumbar - Left Rotation  WFLs      Strength   Overall Strength  Deficits    Overall Strength Comments  bilateral hip flexion 4/5, hip abduction 5/5, bilat knee flexion/extension 5/5, bilat ankle DF 5/5      Palpation   Spinal mobility  low thoracic and lumbar spinal mobility WNLs without pain provocation    SI assessment   compression test negative R and L sides     Palpation comment  No tenderness to palpation today.      Special Tests    Special Tests  Sacrolliac Tests;Hip Special Tests    Sacroiliac Tests   Sacral Compression    Hip Special Tests   Saralyn Pilar (FABER) Test;Hip Scouring;Other      Pelvic Compression   Findings  Negative      Sacral Compression   Findings  Negative      Saralyn Pilar (FABER) Test   Findings  Negative      Hip Scouring   Findings  Negative      other   Findings  Negative    Comments  *hip FADIR test                  Objective measurements completed on examination:  See above findings.              PT Education - 05/08/20 1236    Education Details  HEP    Person(s) Educated  Patient    Methods  Explanation;Demonstration;Handout    Comprehension  Verbalized understanding;Returned demonstration          PT Long Term Goals - 05/08/20 1238      PT LONG TERM GOAL #1   Title  The patient will be indep with HEP for LE strengthening, core engagement and hip flexibility    Time  6    Period  Weeks    Target Date  06/19/20      PT LONG TERM GOAL #2   Title  The patient will report no incidence of L low back pain during standing tasks > 1 hour.    Time  6    Period  Weeks    Target Date  06/19/20      PT LONG TERM GOAL #3   Title  The patient will tolerate prone laying without left side low back pain.    Time  6    Period  Weeks    Target Date  06/19/20      PT LONG TERM GOAL #4   Title  The patient will return demo core strengthening for yoga/wellness plan to ensure good technique for    Time  6    Period  Weeks    Target Date  06/19/20             Plan - 05/08/20 1240    Clinical Impression Statement  The patient is a 59 yo female presenting to OP physical therapy for chronic low back pain.  At today's evaluation, she has hip flexion weakness, pain with end range lumbar AROM, hip flexor tightness.  She is limited in prolonged standing tolerance and notes  some pain with bed mobility.  PT to address deficits to return to prior functional status without pain.    Stability/Clinical Decision Making  Stable/Uncomplicated    Clinical Decision Making  Low    Rehab Potential  Good    PT Frequency  1x / week    PT Duration  6 weeks    PT Treatment/Interventions  ADLs/Self Care Home Management;Gait training;Stair training;Functional mobility training;Therapeutic activities;Therapeutic exercise;Moist Heat;Traction;Electrical Stimulation;Patient/family education;Taping;Dry needling;Manual techniques    PT Next Visit Plan  check HEP,  work on plank/core activities she performs in wellness routine to ensure dec'd lumbar lordosis with activities.    PT Home Exercise Plan  Access Code: [email or web address]    Consulted and Agree with Plan of Care  Patient       Patient will benefit from skilled therapeutic intervention in order to improve the following deficits and impairments:  Pain, Decreased range of motion, Impaired flexibility  Visit Diagnosis: Chronic left-sided low back pain without sciatica  Muscle weakness (generalized)   PHYSICAL THERAPY DISCHARGE SUMMARY  Visits from Start of Care: eval only  Current functional level related to goals / functional outcomes: N/a-- did not return   Remaining deficits: See above for patient status   Education / Equipment: Home program initiated.  Plan: Patient agrees to discharge.  Patient goals were not met. Patient is being discharged due to not returning since the last visit.  ?????        Thank you for the referral of this patient. Rudell Cobb, MPT   Problem List Patient Active Problem List   Diagnosis Date Noted  . Chronic left-sided low back pain 11/05/2019  . Hot flashes, menopausal 11/05/2019  . 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 Rhinitis, allergic 05/23/2015  . Elevated blood pressure reading without diagnosis of hypertension 05/23/2015  . Itching 05/23/2015    Mizraim Harmening, PT 05/08/2020, 12:46 PM  [facility] [place], [place], [address] Phone: [phone]   Fax:  [phone]  Name: Jonesha Tsuchiya MRN: [id number] Date of Birth: 10/31/1961

## 2020-05-08 NOTE — Patient Instructions (Signed)
Access Code: [email or web address] URL: [email or web address] Date: 05/08/2020 Prepared by: Margretta Ditty  Exercises Supine Double Knee to Chest - 2 x daily - 7 x weekly - 1 sets - 3 reps - 30 seconds hold Supine Piriformis Stretch - 2 x daily - 7 x weekly - 1 sets - 3 reps - 30 seconds hold Thomas Stretch on Table - 2 x daily - 7 x weekly - 1 sets - 3 reps - 30 seconds hold X Band Walk - 2 x daily - 7 x weekly - 1 sets - 10 reps Standing Transverse Abdominis Contraction - 2 x daily - 7 x weekly - 1 sets - 10 reps Wall Slide with Posterior Pelvic Tilt - 2 x daily - 7 x weekly - 1 sets - 10 reps

## 2020-05-25 ENCOUNTER — Other Ambulatory Visit: Payer: Self-pay | Admitting: Osteopathic Medicine

## 2020-05-25 ENCOUNTER — Other Ambulatory Visit: Payer: Self-pay

## 2020-05-25 MED ORDER — HYDROCHLOROTHIAZIDE 25 MG PO TABS: 12.5000 mg | ORAL_TABLET | Freq: Every day | ORAL | 0 refills | Status: DC

## 2020-06-16 ENCOUNTER — Ambulatory Visit: Payer: No Typology Code available for payment source | Admitting: Sports Medicine

## 2020-09-08 ENCOUNTER — Other Ambulatory Visit: Payer: Self-pay | Admitting: Osteopathic Medicine

## 2020-10-02 ENCOUNTER — Other Ambulatory Visit: Payer: Self-pay | Admitting: Osteopathic Medicine

## 2020-10-02 DIAGNOSIS — Z1231 Encounter for screening mammogram for malignant neoplasm of breast: Secondary | ICD-10-CM

## 2020-10-14 ENCOUNTER — Ambulatory Visit: Payer: No Typology Code available for payment source

## 2020-10-21 ENCOUNTER — Other Ambulatory Visit: Payer: Self-pay

## 2020-10-21 ENCOUNTER — Ambulatory Visit (INDEPENDENT_AMBULATORY_CARE_PROVIDER_SITE_OTHER): Payer: No Typology Code available for payment source

## 2020-10-21 DIAGNOSIS — Z1231 Encounter for screening mammogram for malignant neoplasm of breast: Secondary | ICD-10-CM | POA: Diagnosis not present

## 2021-02-21 ENCOUNTER — Other Ambulatory Visit: Payer: Self-pay | Admitting: Osteopathic Medicine

## 2021-03-09 ENCOUNTER — Ambulatory Visit (INDEPENDENT_AMBULATORY_CARE_PROVIDER_SITE_OTHER): Payer: No Typology Code available for payment source | Admitting: Osteopathic Medicine

## 2021-03-09 ENCOUNTER — Other Ambulatory Visit: Payer: Self-pay

## 2021-03-09 ENCOUNTER — Encounter: Payer: Self-pay | Admitting: Osteopathic Medicine

## 2021-03-09 VITALS — BP 154/82 | HR 80 | Resp 20 | Ht 64.0 in | Wt 166.0 lb

## 2021-03-09 DIAGNOSIS — I10 Essential (primary) hypertension: Secondary | ICD-10-CM | POA: Diagnosis not present

## 2021-03-09 DIAGNOSIS — Z0000 Encounter for general adult medical examination without abnormal findings: Secondary | ICD-10-CM

## 2021-03-09 NOTE — Progress Notes (Signed)
Christine Miles is a 60 y.o. female who presents to  [facility] at [facility]  today, 03/09/21, seeking care for the following:  . Preventive care physical  . HTN - overdue for follow-up, last seen 05/2019. Reports home SBP 120-130s max.   BP Readings from Last 3 Encounters:  03/09/21 (!) 154/82  01/03/20 (!) 144/94  02/14/19 (!) 174/107      ASSESSMENT & PLAN with other pertinent findings:  The primary encounter diagnosis was Annual physical exam. A diagnosis of Essential hypertension was also pertinent to this visit.    Patient Instructions  General Preventive Care  Most recent routine screening labs: ordered.   Blood pressure goal 130/80 or less.   Tobacco: don't!  Alcohol: responsible moderation is ok for most adults - if you have concerns about your alcohol intake, please talk to me!   Exercise: as tolerated to reduce risk of cardiovascular disease and diabetes. Strength training will also prevent osteoporosis.   Mental health: if need for mental health care (medicines, counseling, other), or concerns about moods, please let me know!   Sexual / Reproductive health: if need for STD testing, or if concerns with libido/pain problems, please let me know!   Advanced Directive: Living Will and/or Healthcare Power of Attorney recommended for all adults, regardless of age or health.  Vaccines  Flu vaccine: for almost everyone, every fall.   Shingles vaccine: after age 60.   Pneumonia vaccines: after age 71.  Tetanus booster: every 10 years - due 2029  COVID vaccine: THANKS for getting your vaccine! :) BOOSTER STRONGLY RECOMMENDED  Cancer screenings   Colon cancer screening: Colonoscopy due 3-5 years after last one 2015 - due!   Breast cancer screening: mammogram due 10/2021   Cervical cancer screening: Pap per OBGYN.   Lung cancer screening: not needed for non-smokers  Infection screenings  . HIV: recommended  screening at least once age 29-65 . Gonorrhea/Chlamydia: screening as needed . Hepatitis C: recommended once for everyone age 33-75 . TB: certain at-risk populations Other . Bone Density Test: recommended at age 41   Orders Placed This Encounter  Procedures  . CBC  . COMPLETE METABOLIC PANEL WITH GFR  . Lipid panel  . TSH    No orders of the defined types were placed in this encounter.    See below for relevant physical exam findings  See below for recent lab and imaging results reviewed  Medications, allergies, PMH, PSH, SocH, FamH reviewed below    Follow-up instructions: Return in about 1 year (around 03/09/2022) for [place] (call week prior to visit for lab orders).                                        Exam:  BP (!) 154/82 (BP Location: Left Arm, Patient Position: Sitting, Cuff Size: Normal)   Pulse 80   Resp 20   Ht 5\' 4"  (1.626 m)   Wt 166 lb (75.3 kg)   SpO2 99%   BMI 28.49 kg/m   Constitutional: VS see above. General Appearance: alert, well-developed, well-nourished, NAD  Neck: No masses, trachea midline.   Respiratory: Normal respiratory effort. no wheeze, no rhonchi, no rales  Cardiovascular: S1/S2 normal, no murmur, no rub/gallop auscultated. RRR.   Musculoskeletal: Gait normal. Symmetric and independent movement of all extremities  Abdominal: non-tender, non-distended, no appreciable organomegaly, neg Murphy's, BS WNLx4  Neurological: Normal balance/coordination. No tremor.  Skin: warm, dry, intact.   Psychiatric: Normal judgment/insight. Normal mood and affect. Oriented x3.   Current Meds  Medication Sig  . Cholecalciferol (PA VITAMIN D-3 GUMMY PO) Take by mouth. Take 3000 /u by mouth daily  . estradiol (ESTRACE) 1 MG tablet Take 1 tablet (1 mg total) by mouth daily.  . Glucosamine HCl-MSM (MSM GLUCOSAMINE PO) Take 2 capsules by mouth daily  . hydrochlorothiazide (HYDRODIURIL) 25 MG tablet Take 1/2  (one-half) tablet by mouth once daily  . Melatonin 300 MCG TABS Take 3-4 tablets by mouth for sleep  . meloxicam (MOBIC) 15 MG tablet One tab PO qAM with a meal for 2 weeks, then daily prn pain.  . Multiple Vitamins-Minerals (MULTIVITAMIN GUMMIES WOMENS) CHEW Take 2 gummies by mouth daily  . NON FORMULARY Native Remedies (AllergyEase  Scent & Phenol) spray 2-3 times in mouth daily  . Omega-3 Fatty Acids (OMEGA-3 PO) Take 100 mg by mouth daily    No Known Allergies  Patient Active Problem List   Diagnosis Date Noted  . Chronic left-sided low back pain 11/05/2019  . Hot flashes, menopausal 11/05/2019  . 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 Rhinitis, allergic 05/23/2015  . Elevated blood pressure reading without diagnosis of hypertension 05/23/2015  . Itching 05/23/2015    Family History  Problem Relation Age of Onset  . Diabetes Father   . Cancer Mother   . Breast cancer Mother   . Breast cancer Maternal Aunt     Social History   Tobacco Use  Smoking Status Never Smoker  Smokeless Tobacco Never Used    Past Surgical History:  Procedure Laterality Date  . BREAST CYST EXCISION Right 1980   age 38  . CESAREAN SECTION  1993  . CYST EXCISION  2001   behind ear  . HERNIA REPAIR  1988   navel  . PARTIAL HYSTERECTOMY  2007    Immunization History  Administered Date(s) Administered  . PFIZER(Purple Top)SARS-COV-2 Vaccination 03/14/2020, 04/04/2020  . Tdap 09/26/2018    No results found for this or any previous visit (from the past 2160 hour(s)).  No results found.     All questions at time of visit were answered - patient instructed to contact office with any additional concerns or updates. ER/RTC precautions were reviewed with the patient as applicable.   Please note: manual typing as well as voice recognition software may have been used to produce this document - typos may escape review. Please contact Dr. Lyn Hollingshead for any needed clarifications.

## 2021-03-09 NOTE — Patient Instructions (Signed)
General Preventive Care  Most recent routine screening labs: ordered.   Blood pressure goal 130/80 or less.   Tobacco: don't!  Alcohol: responsible moderation is ok for most adults - if you have concerns about your alcohol intake, please talk to me!   Exercise: as tolerated to reduce risk of cardiovascular disease and diabetes. Strength training will also prevent osteoporosis.   Mental health: if need for mental health care (medicines, counseling, other), or concerns about moods, please let me know!   Sexual / Reproductive health: if need for STD testing, or if concerns with libido/pain problems, please let me know!   Advanced Directive: Living Will and/or Healthcare Power of Attorney recommended for all adults, regardless of age or health.  Vaccines  Flu vaccine: for almost everyone, every fall.   Shingles vaccine: after age 23.   Pneumonia vaccines: after age 55.  Tetanus booster: every 10 years - due 2029  COVID vaccine: THANKS for getting your vaccine! :) BOOSTER STRONGLY RECOMMENDED  Cancer screenings   Colon cancer screening: Colonoscopy due 3-5 years after last one 2015 - due!   Breast cancer screening: mammogram due 10/2021   Cervical cancer screening: Pap per OBGYN.   Lung cancer screening: not needed for non-smokers  Infection screenings  . HIV: recommended screening at least once age 4-65 . Gonorrhea/Chlamydia: screening as needed . Hepatitis C: recommended once for everyone age 32-75 . TB: certain at-risk populations Other . Bone Density Test: recommended at age 15

## 2021-03-10 LAB — COMPLETE METABOLIC PANEL WITH GFR
AG Ratio: 1.5 (calc) (ref 1.0–2.5)
ALT: 13 U/L (ref 6–29)
AST: 17 U/L (ref 10–35)
Albumin: 4.5 g/dL (ref 3.6–5.1)
Alkaline phosphatase (APISO): 70 U/L (ref 37–153)
BUN: 10 mg/dL (ref 7–25)
CO2: 29 mmol/L (ref 20–32)
Calcium: 10 mg/dL (ref 8.6–10.4)
Chloride: 101 mmol/L (ref 98–110)
Creat: 0.87 mg/dL (ref 0.50–1.05)
GFR, Est African American: 85 mL/min/{1.73_m2} (ref >=60)
GFR, Est Non African American: 73 mL/min/{1.73_m2} (ref >=60)
Globulin: 3.1 g/dL (calc) (ref 1.9–3.7)
Glucose, Bld: 81 mg/dL (ref 65–139)
Potassium: 4 mmol/L (ref 3.5–5.3)
Sodium: 139 mmol/L (ref 135–146)
Total Bilirubin: 1.1 mg/dL (ref 0.2–1.2)
Total Protein: 7.6 g/dL (ref 6.1–8.1)

## 2021-03-10 LAB — CBC
HCT: 41.5 % (ref 35.0–45.0)
Hemoglobin: 14.1 g/dL (ref 11.7–15.5)
MCH: 31.8 pg (ref 27.0–33.0)
MCHC: 34 g/dL (ref 32.0–36.0)
MCV: 93.5 fL (ref 80.0–100.0)
MPV: 10.4 fL (ref 7.5–12.5)
Platelets: 261 10*3/uL (ref 140–400)
RBC: 4.44 10*6/uL (ref 3.80–5.10)
RDW: 12.2 % (ref 11.0–15.0)
WBC: 3 10*3/uL — ABNORMAL LOW (ref 3.8–10.8)

## 2021-03-10 LAB — LIPID PANEL
Cholesterol: 166 mg/dL (ref <200)
HDL: 63 mg/dL (ref >=50)
LDL Cholesterol (Calc): 88 mg/dL (calc)
Non-HDL Cholesterol (Calc): 103 mg/dL (calc) (ref <130)
Total CHOL/HDL Ratio: 2.6 (calc) (ref <5.0)
Triglycerides: 63 mg/dL (ref <150)

## 2021-03-10 LAB — TSH: TSH: 1 mIU/L (ref 0.40–4.50)

## 2021-04-26 ENCOUNTER — Ambulatory Visit: Payer: No Typology Code available for payment source | Admitting: Obstetrics and Gynecology

## 2021-06-15 ENCOUNTER — Encounter: Payer: Self-pay | Admitting: Obstetrics and Gynecology

## 2021-06-15 ENCOUNTER — Ambulatory Visit: Payer: Self-pay | Admitting: Obstetrics and Gynecology

## 2021-06-15 ENCOUNTER — Other Ambulatory Visit: Payer: Self-pay

## 2021-06-15 NOTE — Progress Notes (Signed)
New patient, establish care. Hx Partial hysterectomy. Mammogram Oct 2021

## 2021-06-17 ENCOUNTER — Other Ambulatory Visit: Payer: Self-pay | Admitting: Osteopathic Medicine

## 2021-06-17 DIAGNOSIS — Z91199 Patient's noncompliance with other medical treatment and regimen due to unspecified reason: Secondary | ICD-10-CM | POA: Insufficient documentation

## 2021-06-17 DIAGNOSIS — Z1211 Encounter for screening for malignant neoplasm of colon: Secondary | ICD-10-CM | POA: Insufficient documentation

## 2021-06-17 NOTE — Progress Notes (Signed)
Patient not seen.  Pt canceled appointment as she desired a female provider.  Mariel Aloe, MD

## 2021-11-18 ENCOUNTER — Other Ambulatory Visit: Payer: Self-pay | Admitting: Medical-Surgical

## 2021-11-18 DIAGNOSIS — Z1231 Encounter for screening mammogram for malignant neoplasm of breast: Secondary | ICD-10-CM

## 2021-12-08 ENCOUNTER — Other Ambulatory Visit: Payer: Self-pay

## 2021-12-08 ENCOUNTER — Ambulatory Visit (INDEPENDENT_AMBULATORY_CARE_PROVIDER_SITE_OTHER): Payer: Self-pay

## 2021-12-08 DIAGNOSIS — Z1231 Encounter for screening mammogram for malignant neoplasm of breast: Secondary | ICD-10-CM

## 2022-03-14 ENCOUNTER — Encounter: Payer: No Typology Code available for payment source | Admitting: Osteopathic Medicine

## 2022-03-14 ENCOUNTER — Encounter: Payer: PRIVATE HEALTH INSURANCE | Admitting: Medical-Surgical

## 2022-04-07 ENCOUNTER — Other Ambulatory Visit: Payer: Self-pay | Admitting: Osteopathic Medicine

## 2022-05-17 ENCOUNTER — Encounter: Payer: PRIVATE HEALTH INSURANCE | Admitting: Medical-Surgical

## 2022-06-09 ENCOUNTER — Other Ambulatory Visit: Payer: Self-pay | Admitting: Family Medicine

## 2022-08-04 ENCOUNTER — Encounter: Payer: PRIVATE HEALTH INSURANCE | Admitting: Medical-Surgical

## 2022-08-15 ENCOUNTER — Other Ambulatory Visit: Payer: Self-pay | Admitting: Family Medicine

## 2022-09-11 NOTE — Progress Notes (Signed)
Complete physical exam  Patient: Christine Miles   DOB: Feb 03, 1961   61 y.o. Female  MRN: [id number]  Subjective:    Chief Complaint  Patient presents with   Annual Exam    Christine Miles is a 61 y.o. female who presents today for a complete physical exam. She reports consuming a general and cutting back on breads and snacks  diet. Home exercise routine includes walking 3-5 miles per day. She generally feels well. She reports sleeping well. She does not have additional problems to discuss today.   Most recent fall risk assessment:    03/09/2021    1:32 PM  Fall Risk   Falls in the past year? 0  Number falls in past yr: 0  Injury with Fall? 0  Risk for fall due to : No Fall Risks  Follow up Falls evaluation completed     Most recent depression screenings:    09/12/2022    3:25 PM 03/09/2021    1:31 PM  PHQ 2/9 Scores  PHQ - 2 Score 0 1    Vision:Not within last year , Dental: No current dental problems and Receives regular dental care, and STD: The patient denies history of sexually transmitted disease.    Patient Care Team: Silvestre Moment, MD (Hematology and Oncology)   Outpatient Medications Prior to Visit  Medication Sig   Cholecalciferol (PA VITAMIN D-3 GUMMY PO) Take by mouth. Take 3000 /u by mouth daily   Glucosamine HCl-MSM (MSM GLUCOSAMINE PO) Take 2 capsules by mouth daily   Melatonin 300 MCG TABS Take 3-4 tablets by mouth for sleep   Multiple Vitamins-Minerals (MULTIVITAMIN GUMMIES WOMENS) CHEW Take 2 gummies by mouth daily   NON FORMULARY Native Remedies (AllergyEase  Scent & Phenol) spray 2-3 times in mouth daily   Omega-3 Fatty Acids (OMEGA-3 PO) Take 100 mg by mouth daily   [DISCONTINUED] hydrochlorothiazide (HYDRODIURIL) 25 MG tablet TAKE 1/2 (ONE-HALF) TABLET BY MOUTH ONCE DAILY. LAST REFILL, MUST KEEP UPCOMING APPOINTMENT FOR FURTHER REFILLS   No facility-administered medications prior to visit.    Review of Systems  Constitutional:   Negative for chills, fever, malaise/fatigue and weight loss.  HENT:  Negative for congestion, ear pain, hearing loss, sinus pain and sore throat.   Eyes:  Negative for blurred vision, photophobia and pain.  Respiratory:  Negative for cough, shortness of breath and wheezing.   Cardiovascular:  Negative for chest pain, palpitations and leg swelling.  Gastrointestinal:  Negative for abdominal pain, constipation, diarrhea, heartburn, nausea and vomiting.  Genitourinary:  Negative for dysuria, frequency and urgency.  Musculoskeletal:  Negative for falls and neck pain.  Skin:  Negative for itching and rash.  Neurological:  Negative for dizziness, weakness and headaches.  Endo/Heme/Allergies:  Negative for polydipsia. Does not bruise/bleed easily.  Psychiatric/Behavioral:  Negative for depression, substance abuse and suicidal ideas. The patient has insomnia (hot flashes). The patient is not nervous/anxious.       Objective:    BP 134/82 (BP Location: Right Arm, Cuff Size: Normal)   Pulse 64   Resp 20   Ht 5\' 4"  (1.626 m)   Wt 166 lb 12.8 oz (75.7 kg)   SpO2 99%   BMI 28.63 kg/m    Physical Exam Constitutional:      General: She is not in acute distress.    Appearance: Normal appearance. She is not ill-appearing.  HENT:     Head: Normocephalic and atraumatic.     Right Ear: Tympanic membrane normal.  Left Ear: Tympanic membrane normal.     Nose: Nose normal.     Mouth/Throat:     Mouth: Mucous membranes are moist.     Pharynx: No oropharyngeal exudate or posterior oropharyngeal erythema.  Eyes:     Extraocular Movements: Extraocular movements intact.     Conjunctiva/sclera: Conjunctivae normal.     Pupils: Pupils are equal, round, and reactive to light.  Neck:     Thyroid: No thyromegaly.     Vascular: No carotid bruit or JVD.     Trachea: Trachea normal.  Cardiovascular:     Rate and Rhythm: Normal rate and regular rhythm.     Pulses: Normal pulses.     Heart sounds:  Normal heart sounds. No murmur heard.    No friction rub. No gallop.  Pulmonary:     Effort: Pulmonary effort is normal. No respiratory distress.     Breath sounds: Normal breath sounds. No wheezing.  Abdominal:     General: Bowel sounds are normal. There is no distension.     Palpations: Abdomen is soft.     Tenderness: There is no abdominal tenderness. There is no guarding.  Musculoskeletal:        General: Normal range of motion.     Cervical back: Normal range of motion and neck supple.  Skin:    General: Skin is warm and dry.  Neurological:     Mental Status: She is alert and oriented to person, place, and time.     Cranial Nerves: No cranial nerve deficit.  Psychiatric:        Mood and Affect: Mood normal.        Behavior: Behavior normal.        Thought Content: Thought content normal.        Judgment: Judgment normal.   No results found for any visits on 09/12/22.     Assessment & Plan:    Routine Health Maintenance and Physical Exam  Immunization History  Administered Date(s) Administered   PFIZER(Purple Top)SARS-COV-2 Vaccination 03/14/2020, 04/04/2020   Tdap 09/26/2018    Health Maintenance  Topic Date Due   COVID-19 Vaccine (3 - Pfizer series) 09/25/2022 (Originally 05/30/2020)   Zoster Vaccines- Shingrix (1 of 2) 12/12/2022 (Originally 02/10/2013)   INFLUENZA VACCINE  03/19/2023 (Originally 07/19/2022)   COLONOSCOPY (Pts 45-17yrs Insurance coverage will need to be confirmed)  02/10/2023   PAP SMEAR-Modifier  07/18/2023   MAMMOGRAM  12/09/2023   TETANUS/TDAP  09/26/2028   HPV VACCINES  Aged Out   Hepatitis C Screening  Discontinued   HIV Screening  Discontinued    Discussed health benefits of physical activity, and encouraged her to engage in regular exercise appropriate for her age and condition.  1. Annual physical exam Checking labs as below. UTD on preventative care except for eye exam, recommend updating. Wellness information provided with AVS.  -  Lipid panel - COMPLETE METABOLIC PANEL WITH GFR - CBC with Differential/Platelet  2. Essential hypertension BP at goal today. Continue HCTZ 25mg  daily. Monitor BP at home with a goal of 130/80 or less. Low sodium diet, regular intentional exercise, and weight loss to a healthy weight recommended. Sending a 3 month refill to the pharmacy but urged patient to establish care with a new PCP so that she has a primary provider to manage her care.   3. Influenza vaccination declined Declined today.   3. Herpes zoster vaccination declined Declined today.   Return for establish care with a new PCP at  your earliest convenience to prevent a delay in refills.   Samuel Bouche, NP

## 2022-09-12 ENCOUNTER — Encounter: Payer: Self-pay | Admitting: Medical-Surgical

## 2022-09-12 ENCOUNTER — Ambulatory Visit (INDEPENDENT_AMBULATORY_CARE_PROVIDER_SITE_OTHER): Payer: PRIVATE HEALTH INSURANCE | Admitting: Medical-Surgical

## 2022-09-12 VITALS — BP 134/82 | HR 64 | Resp 20 | Ht 64.0 in | Wt 166.8 lb

## 2022-09-12 DIAGNOSIS — Z7689 Persons encountering health services in other specified circumstances: Secondary | ICD-10-CM

## 2022-09-12 DIAGNOSIS — Z2821 Immunization not carried out because of patient refusal: Secondary | ICD-10-CM

## 2022-09-12 DIAGNOSIS — Z23 Encounter for immunization: Secondary | ICD-10-CM

## 2022-09-12 DIAGNOSIS — Z0000 Encounter for general adult medical examination without abnormal findings: Secondary | ICD-10-CM

## 2022-09-12 DIAGNOSIS — I10 Essential (primary) hypertension: Secondary | ICD-10-CM

## 2022-09-12 MED ORDER — HYDROCHLOROTHIAZIDE 25 MG PO TABS: 25.0000 mg | ORAL_TABLET | Freq: Every day | ORAL | 0 refills | Status: DC

## 2022-09-13 LAB — COMPLETE METABOLIC PANEL WITH GFR
AG Ratio: 1.5 (calc) (ref 1.0–2.5)
ALT: 11 U/L (ref 6–29)
AST: 18 U/L (ref 10–35)
Albumin: 4.6 g/dL (ref 3.6–5.1)
Alkaline phosphatase (APISO): 67 U/L (ref 37–153)
BUN: 20 mg/dL (ref 7–25)
CO2: 30 mmol/L (ref 20–32)
Calcium: 10 mg/dL (ref 8.6–10.4)
Chloride: 100 mmol/L (ref 98–110)
Creat: 0.88 mg/dL (ref 0.50–1.03)
Globulin: 3.1 g/dL (calc) (ref 1.9–3.7)
Glucose, Bld: 87 mg/dL (ref 65–99)
Potassium: 4.4 mmol/L (ref 3.5–5.3)
Sodium: 139 mmol/L (ref 135–146)
Total Bilirubin: 1.3 mg/dL — ABNORMAL HIGH (ref 0.2–1.2)
Total Protein: 7.7 g/dL (ref 6.1–8.1)
eGFR: 76 mL/min/{1.73_m2} (ref >=60)

## 2022-09-13 LAB — CBC WITH DIFFERENTIAL/PLATELET
Absolute Monocytes: 299 cells/uL (ref 200–950)
Basophils Absolute: 31 cells/uL (ref 0–200)
Basophils Relative: 0.9 %
Eosinophils Absolute: 201 cells/uL (ref 15–500)
Eosinophils Relative: 5.9 %
HCT: 40.6 % (ref 35.0–45.0)
Hemoglobin: 13.8 g/dL (ref 11.7–15.5)
Lymphs Abs: 1646 cells/uL (ref 850–3900)
MCH: 32 pg (ref 27.0–33.0)
MCHC: 34 g/dL (ref 32.0–36.0)
MCV: 94.2 fL (ref 80.0–100.0)
MPV: 9.8 fL (ref 7.5–12.5)
Monocytes Relative: 8.8 %
Neutro Abs: 1224 cells/uL — ABNORMAL LOW (ref 1500–7800)
Neutrophils Relative %: 36 %
Platelets: 257 10*3/uL (ref 140–400)
RBC: 4.31 10*6/uL (ref 3.80–5.10)
RDW: 11.8 % (ref 11.0–15.0)
Total Lymphocyte: 48.4 %
WBC: 3.4 10*3/uL — ABNORMAL LOW (ref 3.8–10.8)

## 2022-09-13 LAB — LIPID PANEL
Cholesterol: 187 mg/dL (ref <200)
HDL: 62 mg/dL (ref >=50)
LDL Cholesterol (Calc): 107 mg/dL (calc) — ABNORMAL HIGH
Non-HDL Cholesterol (Calc): 125 mg/dL (calc) (ref <130)
Total CHOL/HDL Ratio: 3 (calc) (ref <5.0)
Triglycerides: 90 mg/dL (ref <150)

## 2022-11-17 ENCOUNTER — Other Ambulatory Visit: Payer: Self-pay | Admitting: Medical-Surgical

## 2022-11-17 DIAGNOSIS — Z1231 Encounter for screening mammogram for malignant neoplasm of breast: Secondary | ICD-10-CM

## 2022-11-21 ENCOUNTER — Ambulatory Visit: Payer: Self-pay | Admitting: Family Medicine

## 2022-12-02 ENCOUNTER — Other Ambulatory Visit: Payer: Self-pay | Admitting: Family Medicine

## 2022-12-26 ENCOUNTER — Ambulatory Visit: Payer: Self-pay | Admitting: Medical-Surgical

## 2022-12-28 ENCOUNTER — Ambulatory Visit: Payer: Self-pay

## 2023-01-04 ENCOUNTER — Ambulatory Visit: Payer: Self-pay

## 2023-01-16 ENCOUNTER — Ambulatory Visit: Payer: Self-pay | Admitting: Medical-Surgical

## 2023-02-08 ENCOUNTER — Ambulatory Visit (INDEPENDENT_AMBULATORY_CARE_PROVIDER_SITE_OTHER): Payer: No Typology Code available for payment source

## 2023-02-08 ENCOUNTER — Other Ambulatory Visit: Payer: Self-pay | Admitting: Medical-Surgical

## 2023-02-08 DIAGNOSIS — Z1231 Encounter for screening mammogram for malignant neoplasm of breast: Secondary | ICD-10-CM

## 2023-03-30 ENCOUNTER — Other Ambulatory Visit: Payer: Self-pay | Admitting: Medical-Surgical

## 2023-05-01 ENCOUNTER — Encounter: Payer: No Typology Code available for payment source | Admitting: Family Medicine

## 2023-05-01 ENCOUNTER — Other Ambulatory Visit: Payer: Self-pay | Admitting: Family Medicine

## 2023-05-01 MED ORDER — HYDROCHLOROTHIAZIDE 25 MG PO TABS: 25.0000 mg | ORAL_TABLET | Freq: Every day | ORAL | 0 refills | Status: DC

## 2023-05-10 ENCOUNTER — Encounter: Payer: Self-pay | Admitting: Family Medicine

## 2023-05-10 ENCOUNTER — Ambulatory Visit: Payer: No Typology Code available for payment source | Admitting: Family Medicine

## 2023-05-10 VITALS — BP 134/79 | HR 64 | Resp 20 | Ht 64.0 in | Wt 163.2 lb

## 2023-05-10 DIAGNOSIS — I10 Essential (primary) hypertension: Secondary | ICD-10-CM | POA: Diagnosis not present

## 2023-05-10 DIAGNOSIS — Z1211 Encounter for screening for malignant neoplasm of colon: Secondary | ICD-10-CM

## 2023-05-10 LAB — CBC
HCT: 43.5 % (ref 35.0–45.0)
MCHC: 33.3 g/dL (ref 32.0–36.0)
Platelets: 304 10*3/uL (ref 140–400)
RBC: 4.76 10*6/uL (ref 3.80–5.10)

## 2023-05-10 MED ORDER — HYDROCHLOROTHIAZIDE 25 MG PO TABS: 25.0000 mg | ORAL_TABLET | Freq: Every day | ORAL | 1 refills | Status: AC

## 2023-05-10 NOTE — Assessment & Plan Note (Signed)
-   discussed cologuard and pt is interested

## 2023-05-10 NOTE — Progress Notes (Signed)
New patient visit   Patient: Christine Miles   DOB: 01-Feb-1961   62 y.o. Female  MRN: [id number] Visit Date: 05/10/2023  Today's healthcare provider: Charlton Amor, DO   Chief Complaint  Patient presents with   Transitions Of Care    SUBJECTIVE    Chief Complaint  Patient presents with   Transitions Of Care   HPI  Pt presents to transfer care. She has a pmh of HTN. She is currently on hctz 25mg . Denies chest pain, shortness of breath, and blurry vision.  Is interested in colon cancer screening. Has no family history of colon cancer.   Review of Systems  Constitutional:  Negative for activity change, fatigue and fever.  Respiratory:  Negative for cough and shortness of breath.   Cardiovascular:  Negative for chest pain.  Gastrointestinal:  Negative for abdominal pain.  Genitourinary:  Negative for difficulty urinating.       Current Meds  Medication Sig   Cholecalciferol (PA VITAMIN D-3 GUMMY PO) Take by mouth. Take 3000 /u by mouth daily   Glucosamine HCl-MSM (MSM GLUCOSAMINE PO) Take 2 capsules by mouth daily   Melatonin 300 MCG TABS Take 3-4 tablets by mouth for sleep   Multiple Vitamins-Minerals (MULTIVITAMIN GUMMIES WOMENS) CHEW Take 2 gummies by mouth daily   NON FORMULARY Native Remedies (AllergyEase  Scent & Phenol) spray 2-3 times in mouth daily   Omega-3 Fatty Acids (OMEGA-3 PO) Take 100 mg by mouth daily   [DISCONTINUED] hydrochlorothiazide (HYDRODIURIL) 25 MG tablet Take 1 tablet (25 mg total) by mouth daily. NEEDS APPOINTMENT FOR FURTHER REFILLS.    OBJECTIVE    BP 134/79 (BP Location: Right Arm, Cuff Size: Normal)   Pulse 64   Resp 20   Ht 5\' 4"  (1.626 m)   Wt 163 lb 3.2 oz (74 kg)   SpO2 98%   BMI 28.01 kg/m   Physical Exam Vitals and nursing note reviewed.  Constitutional:      General: She is not in acute distress.    Appearance: Normal appearance.  HENT:     Head: Normocephalic and atraumatic.     Right Ear: External ear normal.      Left Ear: External ear normal.     Nose: Nose normal.  Eyes:     Conjunctiva/sclera: Conjunctivae normal.  Cardiovascular:     Rate and Rhythm: Normal rate and regular rhythm.  Pulmonary:     Effort: Pulmonary effort is normal.     Breath sounds: Normal breath sounds.  Neurological:     General: No focal deficit present.     Mental Status: She is alert and oriented to person, place, and time.  Psychiatric:        Mood and Affect: Mood normal.        Behavior: Behavior normal.        Thought Content: Thought content normal.        Judgment: Judgment normal.          ASSESSMENT & PLAN    Problem List Items Addressed This Visit       Cardiovascular and Mediastinum   Essential hypertension - Primary    - continue hctz  - ordered blood work today  - follow up in 6 months      Relevant Medications   hydrochlorothiazide (HYDRODIURIL) 25 MG tablet   Other Relevant Orders   Microalbumin / creatinine urine ratio   CBC   COMPLETE METABOLIC PANEL WITH GFR   Lipid  panel     Other   Screening for colon cancer    - discussed cologuard and pt is interested       Relevant Orders   Cologuard    Return in about 6 months (around 11/10/2023).      Meds ordered this encounter  Medications   hydrochlorothiazide (HYDRODIURIL) 25 MG tablet    Sig: Take 1 tablet (25 mg total) by mouth daily.    Dispense:  90 tablet    Refill:  1    Orders Placed This Encounter  Procedures   Microalbumin / creatinine urine ratio   CBC   COMPLETE METABOLIC PANEL WITH GFR   Lipid panel    Order Specific Question:   Has the patient fasted?    Answer:   No    Order Specific Question:   Release to patient    Answer:   Immediate   Cologuard     Charlton Amor, DO  [facility] at [place] [facility] [phone] (phone) [phone] (fax)  [facility]

## 2023-05-10 NOTE — Assessment & Plan Note (Signed)
-   continue hctz  - ordered blood work today  - follow up in 6 months

## 2023-05-11 LAB — COMPLETE METABOLIC PANEL WITH GFR
AG Ratio: 1.6 (calc) (ref 1.0–2.5)
ALT: 14 U/L (ref 6–29)
AST: 20 U/L (ref 10–35)
Albumin: 5.1 g/dL (ref 3.6–5.1)
Alkaline phosphatase (APISO): 89 U/L (ref 37–153)
BUN/Creatinine Ratio: 9 (calc) (ref 6–22)
BUN: 10 mg/dL (ref 7–25)
CO2: 30 mmol/L (ref 20–32)
Calcium: 10.6 mg/dL — ABNORMAL HIGH (ref 8.6–10.4)
Chloride: 97 mmol/L — ABNORMAL LOW (ref 98–110)
Creat: 1.14 mg/dL — ABNORMAL HIGH (ref 0.50–1.05)
Globulin: 3.1 g/dL (calc) (ref 1.9–3.7)
Glucose, Bld: 92 mg/dL (ref 65–99)
Potassium: 3.8 mmol/L (ref 3.5–5.3)
Sodium: 139 mmol/L (ref 135–146)
Total Bilirubin: 0.8 mg/dL (ref 0.2–1.2)
Total Protein: 8.2 g/dL — ABNORMAL HIGH (ref 6.1–8.1)
eGFR: 55 mL/min/{1.73_m2} — ABNORMAL LOW (ref >=60)

## 2023-05-11 LAB — CBC
Hemoglobin: 14.5 g/dL (ref 11.7–15.5)
MCH: 30.5 pg (ref 27.0–33.0)
MCV: 91.4 fL (ref 80.0–100.0)
MPV: 9.9 fL (ref 7.5–12.5)
RDW: 12.1 % (ref 11.0–15.0)
WBC: 3.5 10*3/uL — ABNORMAL LOW (ref 3.8–10.8)

## 2023-05-11 LAB — LIPID PANEL
Cholesterol: 184 mg/dL (ref <200)
HDL: 79 mg/dL (ref >=50)
LDL Cholesterol (Calc): 88 mg/dL (calc)
Non-HDL Cholesterol (Calc): 105 mg/dL (calc) (ref <130)
Total CHOL/HDL Ratio: 2.3 (calc) (ref <5.0)
Triglycerides: 77 mg/dL (ref <150)

## 2023-05-11 LAB — MICROALBUMIN / CREATININE URINE RATIO
Creatinine, Urine: 190 mg/dL (ref 20–275)
Microalb Creat Ratio: 3 mg/g creat (ref <30)
Microalb, Ur: 0.5 mg/dL

## 2023-05-27 LAB — COLOGUARD: COLOGUARD: NEGATIVE

## 2023-11-13 ENCOUNTER — Ambulatory Visit: Payer: No Typology Code available for payment source | Admitting: Family Medicine

## 2023-11-20 ENCOUNTER — Ambulatory Visit: Payer: No Typology Code available for payment source | Admitting: Family Medicine

## 2023-11-27 ENCOUNTER — Encounter: Payer: Self-pay | Admitting: Family Medicine

## 2023-11-27 ENCOUNTER — Ambulatory Visit: Payer: No Typology Code available for payment source | Admitting: Family Medicine

## 2023-11-27 VITALS — BP 128/84 | HR 69 | Ht 64.0 in | Wt 174.8 lb

## 2023-11-27 DIAGNOSIS — R0982 Postnasal drip: Secondary | ICD-10-CM

## 2023-11-27 DIAGNOSIS — N951 Menopausal and female climacteric states: Secondary | ICD-10-CM

## 2023-11-27 DIAGNOSIS — I10 Essential (primary) hypertension: Secondary | ICD-10-CM | POA: Diagnosis not present

## 2023-11-27 DIAGNOSIS — R232 Flushing: Secondary | ICD-10-CM | POA: Insufficient documentation

## 2023-11-27 MED ORDER — CETIRIZINE HCL 10 MG PO TABS: 10.0000 mg | ORAL_TABLET | Freq: Every day | ORAL | 3 refills | Status: DC

## 2023-11-27 MED ORDER — VEOZAH 45 MG PO TABS: 1.0000 | ORAL_TABLET | Freq: Every day | ORAL | 3 refills | Status: DC

## 2023-11-27 NOTE — Assessment & Plan Note (Signed)
Pt bp well controlled today she is interested in getting off her bp meds, have gone ahead and discussed with her we can do 1/2 tablet and have her follow up in one month - will get cmp today

## 2023-11-27 NOTE — Patient Instructions (Signed)
Take 1/2 tablet of hydrochlorothiazide

## 2023-11-27 NOTE — Assessment & Plan Note (Addendum)
Pt notes nightly hot flashes (suspect these are related to menopause) will go ahead and get thyroid level as well as cbc with diff to rule out other differentials - pt also notes associated palpitations with the hot flashes so we will investigate for iron deficiency as well as thyroid issues  - have gone ahead and sent in veozah for patient as this is a non hormonal therapy and pt was on HRT at one time and it did not work well for her and gave her bad side effects

## 2023-11-27 NOTE — Progress Notes (Signed)
Established patient visit   Patient: Christine Miles   DOB: 1961/11/22   62 y.o. Female  MRN: [id number] Visit Date: 11/27/2023  Today's healthcare provider: Charlton Amor, DO   Chief Complaint  Patient presents with   Medical Management of Chronic Issues    HTN, pt would also like to discuss insomnia and post nasal drip    SUBJECTIVE    Chief Complaint  Patient presents with   Medical Management of Chronic Issues    HTN, pt would also like to discuss insomnia and post nasal drip   HPI HPI     Medical Management of Chronic Issues    Additional comments: HTN, pt would also like to discuss insomnia and post nasal drip      Last edited by Roselyn Reef, CMA on 11/27/2023  3:04 PM.      Pt presents for HTN follow up. She is currently on 25mg  hydrochlorothiazide. BP well controlled today.  HTN BP well controlled today at 128/84 - denies chest pain, shortness of breath, blurry vision - pt does have concerns of bp and would like to decrease her medication  Does admit to 5-6 months of post nasal drip and some insomnia.  Review of Systems  Constitutional:  Negative for activity change, fatigue and fever.  HENT:  Positive for postnasal drip.   Respiratory:  Negative for cough and shortness of breath.   Cardiovascular:  Negative for chest pain.  Gastrointestinal:  Negative for abdominal pain.  Genitourinary:  Negative for difficulty urinating.       Current Meds  Medication Sig   cetirizine (ZYRTEC) 10 MG tablet Take 1 tablet (10 mg total) by mouth daily.   Cholecalciferol (PA VITAMIN D-3 GUMMY PO) Take by mouth. Take 3000 /u by mouth daily   Fezolinetant (VEOZAH) 45 MG TABS Take 1 tablet (45 mg total) by mouth daily.   Glucosamine HCl-MSM (MSM GLUCOSAMINE PO) Take 2 capsules by mouth daily   hydrochlorothiazide (HYDRODIURIL) 25 MG tablet Take 1 tablet (25 mg total) by mouth daily.   Melatonin 300 MCG TABS Take 3-4 tablets by mouth for sleep   Multiple  Vitamins-Minerals (MULTIVITAMIN GUMMIES WOMENS) CHEW Take 2 gummies by mouth daily   NON FORMULARY Native Remedies (AllergyEase  Scent & Phenol) spray 2-3 times in mouth daily   Omega-3 Fatty Acids (OMEGA-3 PO) Take 100 mg by mouth daily    OBJECTIVE    BP 128/84 (BP Location: Left Arm, Patient Position: Sitting, Cuff Size: Large)   Pulse 69   Ht 5\' 4"  (1.626 m)   Wt 174 lb 12 oz (79.3 kg)   SpO2 100%   BMI 30.00 kg/m   Physical Exam Vitals and nursing note reviewed.  Constitutional:      General: She is not in acute distress.    Appearance: Normal appearance.  HENT:     Head: Normocephalic and atraumatic.     Right Ear: External ear normal.     Left Ear: External ear normal.     Nose: Nose normal.  Eyes:     Conjunctiva/sclera: Conjunctivae normal.  Cardiovascular:     Rate and Rhythm: Normal rate and regular rhythm.  Pulmonary:     Effort: Pulmonary effort is normal.     Breath sounds: Normal breath sounds.  Neurological:     General: No focal deficit present.     Mental Status: She is alert and oriented to person, place, and time.  Psychiatric:  Mood and Affect: Mood normal.        Behavior: Behavior normal.        Thought Content: Thought content normal.        Judgment: Judgment normal.        ASSESSMENT & PLAN    Problem List Items Addressed This Visit       Cardiovascular and Mediastinum   Essential hypertension - Primary    Pt bp well controlled today she is interested in getting off her bp meds, have gone ahead and discussed with her we can do 1/2 tablet and have her follow up in one month - will get cmp today      Relevant Orders   CMP14+EGFR   Hot flashes    Pt notes nightly hot flashes (suspect these are related to menopause) will go ahead and get thyroid level as well as cbc with diff to rule out other differentials - pt also notes associated palpitations with the hot flashes so we will investigate for iron deficiency as well as thyroid  issues  - have gone ahead and sent in veozah for patient as this is a non hormonal therapy and pt was on HRT at one time and it did not work well for her and gave her bad side effects       Relevant Orders   CBC with Differential   TSH + free T4   Fe+TIBC+Fer     Other   Post-nasal drip   Relevant Medications   cetirizine (ZYRTEC) 10 MG tablet   Vasomotor symptoms due to menopause   Relevant Medications   Fezolinetant (VEOZAH) 45 MG TABS    Return in about 4 weeks (around 12/25/2023) for HTN follow up.      Meds ordered this encounter  Medications   cetirizine (ZYRTEC) 10 MG tablet    Sig: Take 1 tablet (10 mg total) by mouth daily.    Dispense:  30 tablet    Refill:  3   Fezolinetant (VEOZAH) 45 MG TABS    Sig: Take 1 tablet (45 mg total) by mouth daily.    Dispense:  30 tablet    Refill:  3    This is going to be used for severe vasomotor symptoms associated with menopause, please apply whichever diagnostic code would work for this.    Orders Placed This Encounter  Procedures   CMP14+EGFR   CBC with Differential   TSH + free T4   Fe+TIBC+Fer     Charlton Amor, DO  [facility] & [facility] Medicine at [place] [facility] [phone] (phone) [phone] (fax)  [facility]

## 2023-11-27 NOTE — Addendum Note (Signed)
Addended by: Charlton Amor on: 11/27/2023 03:39 PM   Modules accepted: Orders

## 2023-11-28 LAB — CMP14+EGFR
ALT: 16 [IU]/L (ref 0–32)
AST: 24 [IU]/L (ref 0–40)
Albumin: 4.5 g/dL (ref 3.8–4.9)
Alkaline Phosphatase: 95 [IU]/L (ref 44–121)
BUN/Creatinine Ratio: 10 — ABNORMAL LOW (ref 12–28)
BUN: 11 mg/dL (ref 8–27)
Bilirubin Total: 1 mg/dL (ref 0.0–1.2)
CO2: 24 mmol/L (ref 20–29)
Calcium: 9.9 mg/dL (ref 8.7–10.3)
Chloride: 99 mmol/L (ref 96–106)
Creatinine, Ser: 1.09 mg/dL — ABNORMAL HIGH (ref 0.57–1.00)
Globulin, Total: 2.8 g/dL (ref 1.5–4.5)
Glucose: 95 mg/dL (ref 70–99)
Potassium: 4.1 mmol/L (ref 3.5–5.2)
Sodium: 138 mmol/L (ref 134–144)
Total Protein: 7.3 g/dL (ref 6.0–8.5)
eGFR: 58 mL/min/{1.73_m2} — ABNORMAL LOW (ref >59)

## 2023-11-28 LAB — CBC WITH DIFFERENTIAL/PLATELET
Basophils Absolute: 0 10*3/uL (ref 0.0–0.2)
Basos: 1 %
EOS (ABSOLUTE): 0.2 10*3/uL (ref 0.0–0.4)
Eos: 5 %
Hematocrit: 40.1 % (ref 34.0–46.6)
Hemoglobin: 13.1 g/dL (ref 11.1–15.9)
Immature Grans (Abs): 0 10*3/uL (ref 0.0–0.1)
Immature Granulocytes: 0 %
Lymphocytes Absolute: 1.5 10*3/uL (ref 0.7–3.1)
Lymphs: 47 %
MCH: 30.3 pg (ref 26.6–33.0)
MCHC: 32.7 g/dL (ref 31.5–35.7)
MCV: 93 fL (ref 79–97)
Monocytes Absolute: 0.3 10*3/uL (ref 0.1–0.9)
Monocytes: 8 %
Neutrophils Absolute: 1.2 10*3/uL — ABNORMAL LOW (ref 1.4–7.0)
Neutrophils: 39 %
Platelets: 259 10*3/uL (ref 150–450)
RBC: 4.33 x10E6/uL (ref 3.77–5.28)
RDW: 11.9 % (ref 11.7–15.4)
WBC: 3.2 10*3/uL — ABNORMAL LOW (ref 3.4–10.8)

## 2023-11-28 LAB — IRON,TIBC AND FERRITIN PANEL
Ferritin: 50 ng/mL (ref 15–150)
Iron Saturation: 36 % (ref 15–55)
Iron: 120 ug/dL (ref 27–159)
Total Iron Binding Capacity: 334 ug/dL (ref 250–450)
UIBC: 214 ug/dL (ref 131–425)

## 2023-11-28 LAB — TSH+FREE T4
Free T4: 1.4 ng/dL (ref 0.82–1.77)
TSH: 1.42 u[IU]/mL (ref 0.450–4.500)

## 2023-11-28 MED ORDER — CETIRIZINE HCL 10 MG PO TABS: 10.0000 mg | ORAL_TABLET | Freq: Every day | ORAL | 3 refills | Status: AC

## 2023-11-29 MED ORDER — VEOZAH 45 MG PO TABS: 1.0000 | ORAL_TABLET | Freq: Every day | ORAL | 3 refills | Status: DC

## 2023-12-02 ENCOUNTER — Encounter: Payer: Self-pay | Admitting: Family Medicine

## 2023-12-02 DIAGNOSIS — N951 Menopausal and female climacteric states: Secondary | ICD-10-CM

## 2023-12-04 ENCOUNTER — Other Ambulatory Visit: Payer: Self-pay

## 2023-12-04 DIAGNOSIS — N951 Menopausal and female climacteric states: Secondary | ICD-10-CM

## 2023-12-04 MED ORDER — VEOZAH 45 MG PO TABS: 1.0000 | ORAL_TABLET | Freq: Every day | ORAL | 3 refills | Status: DC

## 2023-12-05 ENCOUNTER — Telehealth: Payer: Self-pay

## 2023-12-05 NOTE — Telephone Encounter (Signed)
Copied from CRM [id number]. Topic: Clinical - Prescription Issue >> Dec 04, 2023  2:12 PM Lorin Glass B wrote: Reason for CRM:   Patient called for Tiffany CMA as she left a message for patient to call her back directly regarding discussion of prior authorization for medication. Callback [phone]

## 2023-12-06 NOTE — Telephone Encounter (Signed)
Call and spoke with the pt regarding the PA process, she voiced her understanding.

## 2023-12-18 ENCOUNTER — Telehealth: Payer: Self-pay

## 2023-12-18 NOTE — Telephone Encounter (Signed)
Initiated Prior authorization UJW:[id number] 45MG  tablets Via: Covermymeds Case/Key:[id number] Status: Pending as of 12/18/23 Reason: Notified Pt via: Mychart

## 2023-12-22 ENCOUNTER — Telehealth: Payer: Self-pay

## 2023-12-22 DIAGNOSIS — N951 Menopausal and female climacteric states: Secondary | ICD-10-CM

## 2023-12-22 MED ORDER — VEOZAH 45 MG PO TABS: 1.0000 | ORAL_TABLET | Freq: Every day | ORAL | 3 refills | Status: DC

## 2023-12-22 NOTE — Telephone Encounter (Signed)
 Patient called back wanting the prescription sent to [facility]. I advised her that the prescription was already sent to [facility].

## 2023-12-22 NOTE — Telephone Encounter (Signed)
 I spoke with the pharmacy.and they ran the prescription through insurance since it was approved. The cost is $525. I called and left a message for patient to call back.

## 2023-12-22 NOTE — Telephone Encounter (Signed)
 Chad called back and states a prescription needs to be sent to [facility].  Prescription sent.

## 2023-12-22 NOTE — Telephone Encounter (Signed)
 The Veozah  company needs to know when the prior authorization is finalized.   Fax for PA [phone]  Approved on December 18, 2023 by [employer] 2017 NCPDP Request Reference Number: [id number]. VEOZAH  TAB 45MG  is approved through 12/17/2024. Your patient may now fill this prescription and it will be covered.

## 2023-12-22 NOTE — Telephone Encounter (Signed)
 I called patient to find out more information. Left message for a return call.

## 2023-12-22 NOTE — Telephone Encounter (Signed)
 Copied from CRM [id number]. Topic: Clinical - Prescription Issue >> Dec 21, 2023 10:26 AM Nila Nephew wrote: Reason for CRM: [name] with [facility] calling to see if we can fax a copy of the prescription to her at [phone].

## 2023-12-26 ENCOUNTER — Ambulatory Visit: Payer: No Typology Code available for payment source | Admitting: Family Medicine

## 2024-01-31 ENCOUNTER — Ambulatory Visit: Payer: No Typology Code available for payment source | Admitting: Family Medicine

## 2024-01-31 ENCOUNTER — Encounter: Payer: Self-pay | Admitting: Family Medicine

## 2024-01-31 VITALS — BP 123/79 | HR 79 | Ht 64.0 in | Wt 162.0 lb

## 2024-01-31 DIAGNOSIS — N951 Menopausal and female climacteric states: Secondary | ICD-10-CM | POA: Diagnosis not present

## 2024-01-31 DIAGNOSIS — R232 Flushing: Secondary | ICD-10-CM

## 2024-01-31 MED ORDER — VEOZAH 45 MG PO TABS: 1.0000 | ORAL_TABLET | Freq: Every day | ORAL | 2 refills | Status: DC

## 2024-01-31 NOTE — Assessment & Plan Note (Signed)
-   will get LFTs to check liver function with medication usage.

## 2024-01-31 NOTE — Progress Notes (Signed)
Established patient visit   Patient: Christine Miles   DOB: Oct 11, 1961   63 y.o. Female  MRN: [id number] Visit Date: 01/31/2024  Today's healthcare provider: Charlton Amor, DO   Chief Complaint  Patient presents with   Medical Management of Chronic Issues    Medication f/u on veozah    SUBJECTIVE    Chief Complaint  Patient presents with   Medical Management of Chronic Issues    Medication f/u on veozah   HPI HPI     Medical Management of Chronic Issues    Additional comments: Medication f/u on veozah      Last edited by Roselyn Reef, CMA on 01/31/2024  3:37 PM.      Pt presents for follow up Veozah. Says medication is working. She has no concerns.   Review of Systems  Constitutional:  Negative for activity change, fatigue and fever.  Respiratory:  Negative for cough and shortness of breath.   Cardiovascular:  Negative for chest pain.  Gastrointestinal:  Negative for abdominal pain.  Genitourinary:  Negative for difficulty urinating.       Current Meds  Medication Sig   cetirizine (ZYRTEC) 10 MG tablet Take 1 tablet (10 mg total) by mouth daily.   Cholecalciferol (PA VITAMIN D-3 GUMMY PO) Take by mouth. Take 3000 /u by mouth daily   Glucosamine HCl-MSM (MSM GLUCOSAMINE PO) Take 2 capsules by mouth daily   hydrochlorothiazide (HYDRODIURIL) 25 MG tablet Take 1 tablet (25 mg total) by mouth daily.   Melatonin 300 MCG TABS Take 3-4 tablets by mouth for sleep   Multiple Vitamins-Minerals (MULTIVITAMIN GUMMIES WOMENS) CHEW Take 2 gummies by mouth daily   NON FORMULARY Native Remedies (AllergyEase  Scent & Phenol) spray 2-3 times in mouth daily   Omega-3 Fatty Acids (OMEGA-3 PO) Take 100 mg by mouth daily   [DISCONTINUED] Fezolinetant (VEOZAH) 45 MG TABS Take 1 tablet (45 mg total) by mouth daily.    OBJECTIVE    BP 123/79 (BP Location: Left Arm, Patient Position: Sitting, Cuff Size: Normal)   Pulse 79   Ht 5\' 4"  (1.626 m)   Wt 162 lb (73.5 kg)   SpO2  97%   BMI 27.81 kg/m   Physical Exam Vitals and nursing note reviewed.  Constitutional:      General: She is not in acute distress.    Appearance: Normal appearance.  HENT:     Head: Normocephalic and atraumatic.     Right Ear: External ear normal.     Left Ear: External ear normal.     Nose: Nose normal.  Eyes:     Conjunctiva/sclera: Conjunctivae normal.  Cardiovascular:     Rate and Rhythm: Normal rate and regular rhythm.  Pulmonary:     Effort: Pulmonary effort is normal.     Breath sounds: Normal breath sounds.  Neurological:     General: No focal deficit present.     Mental Status: She is alert and oriented to person, place, and time.  Psychiatric:        Mood and Affect: Mood normal.        Behavior: Behavior normal.        Thought Content: Thought content normal.        Judgment: Judgment normal.        ASSESSMENT & PLAN    Problem List Items Addressed This Visit       Cardiovascular and Mediastinum   Hot flashes - Primary   Relevant Medications  Fezolinetant (VEOZAH) 45 MG TABS   Other Relevant Orders   CMP14+EGFR     Other   Vasomotor symptoms due to menopause   - will get LFTs to check liver function with medication usage.      Relevant Medications   Fezolinetant (VEOZAH) 45 MG TABS    Return in about 6 months (around 07/30/2024).      Meds ordered this encounter  Medications   Fezolinetant (VEOZAH) 45 MG TABS    Sig: Take 1 tablet (45 mg total) by mouth daily.    Dispense:  90 tablet    Refill:  2    This is going to be used for severe vasomotor symptoms associated with menopause, please apply whichever diagnostic code would work for this.    Orders Placed This Encounter  Procedures   CMP14+EGFR     Charlton Amor, DO  [facility] Primary [facility] & Sports Medicine at [facility] [phone] (phone) [phone] (fax)  [facility]

## 2024-02-01 LAB — CMP14+EGFR
ALT: 11 [IU]/L (ref 0–32)
AST: 19 [IU]/L (ref 0–40)
Albumin: 4.5 g/dL (ref 3.8–4.9)
Alkaline Phosphatase: 78 [IU]/L (ref 44–121)
BUN/Creatinine Ratio: 12 (ref 12–28)
BUN: 12 mg/dL (ref 8–27)
Bilirubin Total: 1.2 mg/dL (ref 0.0–1.2)
CO2: 27 mmol/L (ref 20–29)
Calcium: 10.1 mg/dL (ref 8.7–10.3)
Chloride: 101 mmol/L (ref 96–106)
Creatinine, Ser: 0.97 mg/dL (ref 0.57–1.00)
Globulin, Total: 2.8 g/dL (ref 1.5–4.5)
Glucose: 82 mg/dL (ref 70–99)
Potassium: 3.9 mmol/L (ref 3.5–5.2)
Sodium: 141 mmol/L (ref 134–144)
Total Protein: 7.3 g/dL (ref 6.0–8.5)
eGFR: 67 mL/min/{1.73_m2} (ref >59)

## 2024-02-02 ENCOUNTER — Encounter: Payer: Self-pay | Admitting: Family Medicine

## 2024-03-22 ENCOUNTER — Other Ambulatory Visit: Payer: Self-pay | Admitting: Family Medicine

## 2024-03-22 DIAGNOSIS — Z1231 Encounter for screening mammogram for malignant neoplasm of breast: Secondary | ICD-10-CM

## 2024-04-17 NOTE — Telephone Encounter (Signed)
 This request has been handled. No further action is required. Please review other telephone encounters for additional information.

## 2024-04-18 ENCOUNTER — Ambulatory Visit

## 2024-04-25 ENCOUNTER — Encounter: Payer: Self-pay | Admitting: Family Medicine

## 2024-05-15 ENCOUNTER — Ambulatory Visit

## 2024-05-15 DIAGNOSIS — Z1231 Encounter for screening mammogram for malignant neoplasm of breast: Secondary | ICD-10-CM

## 2024-05-19 ENCOUNTER — Ambulatory Visit: Payer: Self-pay | Admitting: Family Medicine

## 2024-05-19 NOTE — Progress Notes (Signed)
 HI Christine Miles, your mammo showed a questionable area in both breasts so they are recommending additional workup. The imaging dep will contact you, if they haven't already, to schedule you.

## 2024-05-20 ENCOUNTER — Other Ambulatory Visit: Payer: Self-pay | Admitting: Family Medicine

## 2024-05-20 DIAGNOSIS — R928 Other abnormal and inconclusive findings on diagnostic imaging of breast: Secondary | ICD-10-CM

## 2024-05-29 ENCOUNTER — Other Ambulatory Visit: Payer: Self-pay

## 2024-05-29 ENCOUNTER — Ambulatory Visit
Admission: RE | Admit: 2024-05-29 | Discharge: 2024-05-29 | Disposition: A | Discharge status: Home / Self Care | Source: Ambulatory Visit | Attending: Family Medicine | Admitting: Family Medicine

## 2024-05-29 DIAGNOSIS — R928 Other abnormal and inconclusive findings on diagnostic imaging of breast: Secondary | ICD-10-CM

## 2024-06-22 ENCOUNTER — Ambulatory Visit: Admitting: Urgent Care

## 2024-07-31 ENCOUNTER — Ambulatory Visit: Admitting: Urgent Care

## 2024-07-31 ENCOUNTER — Ambulatory Visit: Payer: No Typology Code available for payment source | Admitting: Family Medicine

## 2024-08-13 ENCOUNTER — Ambulatory Visit: Admitting: Urgent Care

## 2024-08-13 ENCOUNTER — Encounter: Payer: Self-pay | Admitting: Urgent Care

## 2024-08-13 VITALS — BP 130/62 | HR 73 | Resp 17 | Ht 64.0 in | Wt 156.8 lb

## 2024-08-13 DIAGNOSIS — R7989 Other specified abnormal findings of blood chemistry: Secondary | ICD-10-CM | POA: Diagnosis not present

## 2024-08-13 DIAGNOSIS — R232 Flushing: Secondary | ICD-10-CM

## 2024-08-13 DIAGNOSIS — J302 Other seasonal allergic rhinitis: Secondary | ICD-10-CM | POA: Diagnosis not present

## 2024-08-13 MED ORDER — CLONIDINE HCL 0.1 MG PO TABS: 0.0500 mg | ORAL_TABLET | Freq: Every day | ORAL | 1 refills | Status: AC

## 2024-08-13 NOTE — Progress Notes (Signed)
 Established Patient Office Visit  Subjective:  Patient ID: Christine Miles, female    DOB: 1961-04-10  Age: 63 y.o. MRN: [id number]  Chief Complaint  Patient presents with   Transitions Of Care    Pt states she doesn't have any questions or concerns     HPI  Discussed the use of AI scribe software for clinical note transcription with the patient, who gave verbal consent to proceed.  History of Present Illness   Christine Miles is a 63 year old female who presents with a change in care and management of hot flashes.  She experiences severe hot flashes primarily at night, which disrupt her sleep. She has been using Veozah  intermittently, but it has not been effective in reducing the frequency or severity of her hot flashes. She stopped taking it entirely for the month of July as she noticed no difference in her symptoms whether she took the medication or not. She experiences hot flashes only at night, which wake her up and prevent her from getting quality sleep.  She occasionally uses cetirizine  as needed for seasonal allergies, particularly in the spring, and reports that ragweed in the fall does not bother her. She also takes hydrochlorothiazide  most days and occasionally uses melatonin, which she finds somewhat helpful for sleep. She takes a women's multivitamin regularly.  Her past medical history includes a heart murmur and a history of a partial hysterectomy. She denies any history of cancer. She has not experienced any issues with blood sugar or cholesterol and has never been on medication for these conditions. She tries to drink at least 2.5 to 3 of 20-ounce bottles throughout the day.  In the past, she was informed during this visit that there was a slight elevation in her kidney function noted in December, which she was previously unaware of. She denies taking anti-inflammatories and was unaware of the kidney issue until it was mentioned. Her white blood cell count has been noted  to be low, which she states is normal for her and has been evaluated previously.       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   Rhinitis, allergic 05/23/2015   Elevated blood pressure reading without diagnosis of hypertension 05/23/2015   Past Medical History:  Diagnosis Date   Anemia    Sarcoidosis    Past Surgical History:  Procedure Laterality Date   BREAST CYST EXCISION Right 1980   age 29   CESAREAN SECTION  3   CYST EXCISION  2001   behind ear   HERNIA REPAIR  1988   navel   PARTIAL HYSTERECTOMY  2007   Social History   Tobacco Use   Smoking status: Never   Smokeless tobacco: Never  Substance Use Topics   Alcohol use: No    Alcohol/week: 0.0 standard drinks of alcohol   Drug use: No      ROS: as noted in HPI  Objective:     BP 130/62   Pulse 73   Resp 17   Ht 5' 4 (1.626 m)   Wt 156 lb 12 oz (71.1 kg)   SpO2 100%   BMI 26.91 kg/m  BP Readings from Last 3 Encounters:  08/13/24  130/62  01/31/24 123/79  11/27/23 128/84   Wt Readings from Last 3 Encounters:  08/13/24 156 lb 12 oz (71.1 kg)  01/31/24 162 lb (73.5 kg)  11/27/23 174 lb 12 oz (79.3 kg)      Physical Exam Vitals and nursing note reviewed. Exam conducted with a chaperone present.  Constitutional:      General: She is not in acute distress.    Appearance: Normal appearance. She is not ill-appearing, toxic-appearing or diaphoretic.  HENT:     Head: Normocephalic and atraumatic.     Nose: Nose normal.     Mouth/Throat:     Mouth: Mucous membranes are moist.     Pharynx: No oropharyngeal exudate or posterior oropharyngeal erythema.  Eyes:     General: No scleral icterus.        Right eye: No discharge.        Left eye: No discharge.     Extraocular Movements: Extraocular movements intact.     Pupils: Pupils are equal, round, and reactive to light.  Neck:     Thyroid: No thyroid mass, thyromegaly or thyroid tenderness.  Cardiovascular:     Rate and Rhythm: Normal rate.  Pulmonary:     Effort: Pulmonary effort is normal. No respiratory distress.  Musculoskeletal:     Cervical back: Normal range of motion and neck supple. No rigidity or tenderness.     Right lower leg: No edema.     Left lower leg: No edema.  Lymphadenopathy:     Cervical: No cervical adenopathy.  Skin:    General: Skin is warm and dry.     Coloration: Skin is not jaundiced.     Findings: No bruising, erythema or rash.  Neurological:     General: No focal deficit present.     Mental Status: She is alert and oriented to person, place, and time.     Gait: Gait normal.  Psychiatric:        Mood and Affect: Mood normal.        Behavior: Behavior normal.      No results found for any visits on 08/13/24.  Last CBC Lab Results  Component Value Date   WBC 3.2 (L) 11/27/2023   HGB 13.1 11/27/2023   HCT 40.1 11/27/2023   MCV 93 11/27/2023   MCH 30.3 11/27/2023   RDW 11.9 11/27/2023   PLT 259 11/27/2023   Last metabolic panel Lab Results  Component Value Date   GLUCOSE 82 01/31/2024   NA 141 01/31/2024   K 3.9 01/31/2024   CL 101 01/31/2024   CO2 27 01/31/2024   BUN 12 01/31/2024   CREATININE 0.97 01/31/2024   EGFR 67 01/31/2024   CALCIUM 10.1 01/31/2024   PROT 7.3 01/31/2024   ALBUMIN 4.5 01/31/2024   LABGLOB 2.8 01/31/2024   BILITOT 1.2 01/31/2024   ALKPHOS 78 01/31/2024   AST 19 01/31/2024   ALT 11 01/31/2024      The 10-year ASCVD risk score (Arnett DK, et al., 2019) is: 5.8%  Assessment & Plan:  Seasonal allergies  Hot flashes -     CBC with Differential/Platelet -     Comprehensive metabolic panel with GFR -     cloNIDine  HCl; Take 0.5 tablets (0.05  mg total) by mouth at bedtime.  Dispense: 45 tablet; Refill: 1  Elevated serum creatinine -     CBC with Differential/Platelet -     Comprehensive metabolic panel with GFR   Assessment and Plan  Menopausal hot flashes Hot flashes primarily nocturnal, affecting sleep. Veozah  ineffective. - Prescribe clonidine , half pill 30 minutes before bedtime. - Discuss clonidine  side effects, including nighttime tiredness. - Monitor clonidine  effectiveness within one week via MyChart.  Hypertension Hypertension controlled with hydrochlorothiazide . BP 130/62. - Continue hydrochlorothiazide . - Occasional home BP monitoring.  General Health Maintenance Routine maintenance discussed. No issues with blood sugar, cholesterol. Normal thyroid screening. Pap smear not needed post-hysterectomy. - Check kidney function and WBC count today. - Provide after visit summary.        Return in about 6 months (around 02/13/2025) for Annual Physical.   Benton LITTIE Gave, PA

## 2024-08-13 NOTE — Patient Instructions (Addendum)
 Please start taking 1/2 tab (0.05mg ) of clonidine  nightly to help with hot flashes. Take about 30 min prior to sleep. This medication may make you sleepy.  Please return in 6 months for annual physical with fasting labs.

## 2024-08-14 ENCOUNTER — Ambulatory Visit: Payer: Self-pay | Admitting: Urgent Care

## 2024-08-14 LAB — COMPREHENSIVE METABOLIC PANEL WITH GFR
ALT: 11 IU/L (ref 0–32)
AST: 15 IU/L (ref 0–40)
Albumin: 4.5 g/dL (ref 3.9–4.9)
Alkaline Phosphatase: 96 IU/L (ref 44–121)
BUN/Creatinine Ratio: 10 — ABNORMAL LOW (ref 12–28)
BUN: 9 mg/dL (ref 8–27)
Bilirubin Total: 1 mg/dL (ref 0.0–1.2)
CO2: 23 mmol/L (ref 20–29)
Calcium: 10 mg/dL (ref 8.7–10.3)
Chloride: 102 mmol/L (ref 96–106)
Creatinine, Ser: 0.9 mg/dL (ref 0.57–1.00)
Globulin, Total: 2.7 g/dL (ref 1.5–4.5)
Glucose: 117 mg/dL — ABNORMAL HIGH (ref 70–99)
Potassium: 3.9 mmol/L (ref 3.5–5.2)
Sodium: 141 mmol/L (ref 134–144)
Total Protein: 7.2 g/dL (ref 6.0–8.5)
eGFR: 73 mL/min/1.73 (ref >59)

## 2024-08-14 LAB — CBC WITH DIFFERENTIAL/PLATELET
Basophils Absolute: 0 x10E3/uL (ref 0.0–0.2)
Basos: 1 %
EOS (ABSOLUTE): 0.1 x10E3/uL (ref 0.0–0.4)
Eos: 3 %
Hematocrit: 41.8 % (ref 34.0–46.6)
Hemoglobin: 13.4 g/dL (ref 11.1–15.9)
Immature Grans (Abs): 0 x10E3/uL (ref 0.0–0.1)
Immature Granulocytes: 0 %
Lymphocytes Absolute: 1.3 x10E3/uL (ref 0.7–3.1)
Lymphs: 45 %
MCH: 30.6 pg (ref 26.6–33.0)
MCHC: 32.1 g/dL (ref 31.5–35.7)
MCV: 95 fL (ref 79–97)
Monocytes Absolute: 0.2 x10E3/uL (ref 0.1–0.9)
Monocytes: 7 %
Neutrophils Absolute: 1.2 x10E3/uL — ABNORMAL LOW (ref 1.4–7.0)
Neutrophils: 44 %
Platelets: 259 x10E3/uL (ref 150–450)
RBC: 4.38 x10E6/uL (ref 3.77–5.28)
RDW: 12.4 % (ref 11.7–15.4)
WBC: 2.8 x10E3/uL — ABNORMAL LOW (ref 3.4–10.8)

## 2024-10-28 ENCOUNTER — Other Ambulatory Visit: Payer: Self-pay | Admitting: Urgent Care

## 2024-10-28 ENCOUNTER — Other Ambulatory Visit: Payer: Self-pay

## 2024-10-28 DIAGNOSIS — N63 Unspecified lump in unspecified breast: Secondary | ICD-10-CM

## 2024-11-29 ENCOUNTER — Ambulatory Visit
Admission: RE | Admit: 2024-11-29 | Discharge: 2024-11-29 | Disposition: A | Source: Ambulatory Visit | Attending: Urgent Care | Admitting: Urgent Care

## 2024-11-29 DIAGNOSIS — N63 Unspecified lump in unspecified breast: Secondary | ICD-10-CM

## 2024-11-30 ENCOUNTER — Ambulatory Visit: Payer: Self-pay | Admitting: Urgent Care

## 2025-02-13 ENCOUNTER — Encounter: Admitting: Urgent Care
